# Patient Record
Sex: Male | Born: 1940 | Race: White | Hispanic: No | Smoking: Former smoker
Health system: Southern US, Community
[De-identification: ages and names within clinical notes are randomized; demographics above are authoritative.]

## PROBLEM LIST (undated history)

## (undated) DIAGNOSIS — M48061 Spinal stenosis, lumbar region without neurogenic claudication: Secondary | ICD-10-CM

## (undated) DIAGNOSIS — E785 Hyperlipidemia, unspecified: Secondary | ICD-10-CM

## (undated) DIAGNOSIS — D126 Benign neoplasm of colon, unspecified: Secondary | ICD-10-CM

## (undated) DIAGNOSIS — E8881 Metabolic syndrome: Secondary | ICD-10-CM

## (undated) DIAGNOSIS — M1990 Unspecified osteoarthritis, unspecified site: Secondary | ICD-10-CM

## (undated) DIAGNOSIS — G4700 Insomnia, unspecified: Secondary | ICD-10-CM

## (undated) DIAGNOSIS — K5790 Diverticulosis of intestine, part unspecified, without perforation or abscess without bleeding: Secondary | ICD-10-CM

## (undated) DIAGNOSIS — N400 Enlarged prostate without lower urinary tract symptoms: Secondary | ICD-10-CM

## (undated) DIAGNOSIS — K219 Gastro-esophageal reflux disease without esophagitis: Secondary | ICD-10-CM

## (undated) DIAGNOSIS — N200 Calculus of kidney: Secondary | ICD-10-CM

## (undated) DIAGNOSIS — I10 Essential (primary) hypertension: Secondary | ICD-10-CM

## (undated) DIAGNOSIS — R7301 Impaired fasting glucose: Secondary | ICD-10-CM

## (undated) DIAGNOSIS — H409 Unspecified glaucoma: Secondary | ICD-10-CM

## (undated) DIAGNOSIS — K589 Irritable bowel syndrome without diarrhea: Secondary | ICD-10-CM

## (undated) DIAGNOSIS — I2510 Atherosclerotic heart disease of native coronary artery without angina pectoris: Secondary | ICD-10-CM

## (undated) HISTORY — DX: Irritable bowel syndrome, unspecified: K58.9

## (undated) HISTORY — DX: Unspecified osteoarthritis, unspecified site: M19.90

## (undated) HISTORY — DX: Benign prostatic hyperplasia without lower urinary tract symptoms: N40.0

## (undated) HISTORY — DX: Insomnia, unspecified: G47.00

## (undated) HISTORY — DX: Gastro-esophageal reflux disease without esophagitis: K21.9

## (undated) HISTORY — DX: Essential (primary) hypertension: I10

## (undated) HISTORY — DX: Benign neoplasm of colon, unspecified: D12.6

## (undated) HISTORY — DX: Spinal stenosis, lumbar region without neurogenic claudication: M48.061

## (undated) HISTORY — DX: Atherosclerotic heart disease of native coronary artery without angina pectoris: I25.10

## (undated) HISTORY — DX: Unspecified glaucoma: H40.9

## (undated) HISTORY — DX: Impaired fasting glucose: R73.01

## (undated) HISTORY — DX: Metabolic syndrome: E88.81

## (undated) HISTORY — DX: Calculus of kidney: N20.0

## (undated) HISTORY — DX: Metabolic syndrome: E88.810

## (undated) HISTORY — DX: Hyperlipidemia, unspecified: E78.5

## (undated) HISTORY — DX: Diverticulosis of intestine, part unspecified, without perforation or abscess without bleeding: K57.90

---

## 2002-06-16 ENCOUNTER — Emergency Department (HOSPITAL_COMMUNITY): Admission: EM | Admit: 2002-06-16 | Discharge: 2002-06-16 | Payer: Self-pay | Admitting: Emergency Medicine

## 2004-03-24 ENCOUNTER — Ambulatory Visit: Payer: Self-pay | Admitting: Internal Medicine

## 2004-03-29 ENCOUNTER — Ambulatory Visit (HOSPITAL_COMMUNITY): Admission: RE | Admit: 2004-03-29 | Discharge: 2004-03-29 | Payer: Self-pay | Admitting: Internal Medicine

## 2004-07-22 ENCOUNTER — Ambulatory Visit: Payer: Self-pay | Admitting: Family Medicine

## 2004-12-24 ENCOUNTER — Ambulatory Visit: Payer: Self-pay | Admitting: Internal Medicine

## 2005-03-09 ENCOUNTER — Ambulatory Visit: Payer: Self-pay | Admitting: Internal Medicine

## 2005-05-10 ENCOUNTER — Ambulatory Visit: Payer: Self-pay | Admitting: Internal Medicine

## 2005-06-14 ENCOUNTER — Ambulatory Visit: Payer: Self-pay | Admitting: Internal Medicine

## 2005-07-13 ENCOUNTER — Ambulatory Visit: Payer: Self-pay | Admitting: Gastroenterology

## 2005-07-29 DIAGNOSIS — D126 Benign neoplasm of colon, unspecified: Secondary | ICD-10-CM

## 2005-07-29 HISTORY — DX: Benign neoplasm of colon, unspecified: D12.6

## 2005-08-04 ENCOUNTER — Encounter: Payer: Self-pay | Admitting: Gastroenterology

## 2005-08-04 ENCOUNTER — Ambulatory Visit: Payer: Self-pay | Admitting: Gastroenterology

## 2005-08-04 DIAGNOSIS — K5730 Diverticulosis of large intestine without perforation or abscess without bleeding: Secondary | ICD-10-CM | POA: Insufficient documentation

## 2005-08-04 DIAGNOSIS — D126 Benign neoplasm of colon, unspecified: Secondary | ICD-10-CM

## 2005-08-04 DIAGNOSIS — K648 Other hemorrhoids: Secondary | ICD-10-CM | POA: Insufficient documentation

## 2005-08-04 LAB — HM COLONOSCOPY

## 2005-08-15 ENCOUNTER — Ambulatory Visit: Payer: Self-pay | Admitting: Internal Medicine

## 2005-09-13 ENCOUNTER — Ambulatory Visit: Payer: Self-pay | Admitting: Internal Medicine

## 2005-11-03 ENCOUNTER — Ambulatory Visit: Payer: Self-pay | Admitting: Internal Medicine

## 2006-01-03 ENCOUNTER — Ambulatory Visit: Payer: Self-pay | Admitting: Internal Medicine

## 2006-02-01 ENCOUNTER — Ambulatory Visit: Payer: Self-pay | Admitting: Internal Medicine

## 2006-04-05 ENCOUNTER — Ambulatory Visit: Payer: Self-pay | Admitting: Internal Medicine

## 2006-06-07 ENCOUNTER — Ambulatory Visit: Payer: Self-pay | Admitting: Internal Medicine

## 2006-08-23 DIAGNOSIS — I10 Essential (primary) hypertension: Secondary | ICD-10-CM

## 2006-08-23 DIAGNOSIS — E785 Hyperlipidemia, unspecified: Secondary | ICD-10-CM | POA: Insufficient documentation

## 2006-08-23 DIAGNOSIS — E782 Mixed hyperlipidemia: Secondary | ICD-10-CM | POA: Insufficient documentation

## 2006-09-12 ENCOUNTER — Ambulatory Visit: Payer: Self-pay | Admitting: Internal Medicine

## 2006-09-18 ENCOUNTER — Ambulatory Visit: Payer: Self-pay | Admitting: Internal Medicine

## 2006-09-18 DIAGNOSIS — N400 Enlarged prostate without lower urinary tract symptoms: Secondary | ICD-10-CM | POA: Insufficient documentation

## 2006-09-18 DIAGNOSIS — N411 Chronic prostatitis: Secondary | ICD-10-CM

## 2006-09-18 DIAGNOSIS — H6690 Otitis media, unspecified, unspecified ear: Secondary | ICD-10-CM | POA: Insufficient documentation

## 2006-09-18 LAB — CONVERTED CEMR LAB
Cholesterol: 183 mg/dL (ref 0–200)
Total CHOL/HDL Ratio: 9.6

## 2006-11-16 ENCOUNTER — Ambulatory Visit: Payer: Self-pay | Admitting: Internal Medicine

## 2006-11-16 DIAGNOSIS — M545 Low back pain: Secondary | ICD-10-CM | POA: Insufficient documentation

## 2006-11-28 ENCOUNTER — Telehealth (INDEPENDENT_AMBULATORY_CARE_PROVIDER_SITE_OTHER): Payer: Self-pay | Admitting: *Deleted

## 2006-11-28 DIAGNOSIS — R42 Dizziness and giddiness: Secondary | ICD-10-CM | POA: Insufficient documentation

## 2006-11-29 ENCOUNTER — Telehealth (INDEPENDENT_AMBULATORY_CARE_PROVIDER_SITE_OTHER): Payer: Self-pay | Admitting: *Deleted

## 2007-02-19 ENCOUNTER — Ambulatory Visit: Payer: Self-pay | Admitting: Internal Medicine

## 2007-02-19 LAB — CONVERTED CEMR LAB
Cholesterol, target level: 200 mg/dL
HDL goal, serum: 40 mg/dL
LDL Goal: 100 mg/dL

## 2007-02-28 ENCOUNTER — Telehealth: Payer: Self-pay | Admitting: Internal Medicine

## 2007-03-20 ENCOUNTER — Telehealth (INDEPENDENT_AMBULATORY_CARE_PROVIDER_SITE_OTHER): Payer: Self-pay | Admitting: *Deleted

## 2007-03-27 ENCOUNTER — Encounter: Payer: Self-pay | Admitting: Internal Medicine

## 2007-04-05 ENCOUNTER — Encounter: Admission: RE | Admit: 2007-04-05 | Discharge: 2007-04-05 | Payer: Self-pay | Admitting: Otolaryngology

## 2007-04-24 ENCOUNTER — Telehealth: Payer: Self-pay | Admitting: Internal Medicine

## 2007-05-15 ENCOUNTER — Ambulatory Visit: Payer: Self-pay | Admitting: Internal Medicine

## 2007-05-15 LAB — CONVERTED CEMR LAB
Cholesterol: 149 mg/dL (ref 0–200)
HDL: 17.7 mg/dL — ABNORMAL LOW (ref >39.0)
LDL Cholesterol: 98 mg/dL (ref 0–99)

## 2007-05-22 ENCOUNTER — Ambulatory Visit: Payer: Self-pay | Admitting: Internal Medicine

## 2007-05-22 DIAGNOSIS — M5030 Other cervical disc degeneration, unspecified cervical region: Secondary | ICD-10-CM | POA: Insufficient documentation

## 2007-05-22 DIAGNOSIS — M19019 Primary osteoarthritis, unspecified shoulder: Secondary | ICD-10-CM | POA: Insufficient documentation

## 2007-05-26 ENCOUNTER — Encounter: Admission: RE | Admit: 2007-05-26 | Discharge: 2007-05-26 | Payer: Self-pay | Admitting: Internal Medicine

## 2007-05-31 ENCOUNTER — Telehealth: Payer: Self-pay | Admitting: Internal Medicine

## 2007-06-07 ENCOUNTER — Telehealth: Payer: Self-pay | Admitting: Internal Medicine

## 2007-08-06 ENCOUNTER — Telehealth: Payer: Self-pay | Admitting: Internal Medicine

## 2007-08-22 ENCOUNTER — Ambulatory Visit: Payer: Self-pay | Admitting: Internal Medicine

## 2007-08-22 LAB — CONVERTED CEMR LAB
BUN: 16 mg/dL (ref 6–23)
Basophils Absolute: 0 10*3/uL (ref 0.0–0.1)
Basophils Relative: 0.6 % (ref 0.0–1.0)
Calcium: 9.1 mg/dL (ref 8.4–10.5)
Creatinine, Ser: 1.1 mg/dL (ref 0.4–1.5)
Eosinophils Absolute: 0.2 10*3/uL (ref 0.0–0.7)
Eosinophils Relative: 2.6 % (ref 0.0–5.0)
GFR calc non Af Amer: 72 mL/min
HCT: 43.7 % (ref 39.0–52.0)
Hemoglobin: 15.5 g/dL (ref 13.0–17.0)
MCHC: 35.5 g/dL (ref 30.0–36.0)
MCV: 93 fL (ref 78.0–100.0)
Monocytes Absolute: 0.4 10*3/uL (ref 0.1–1.0)
Neutro Abs: 3.4 10*3/uL (ref 1.4–7.7)
RBC: 4.7 M/uL (ref 4.22–5.81)

## 2007-09-11 ENCOUNTER — Telehealth: Payer: Self-pay | Admitting: Internal Medicine

## 2007-09-12 ENCOUNTER — Ambulatory Visit: Payer: Self-pay | Admitting: Internal Medicine

## 2007-09-27 ENCOUNTER — Telehealth: Payer: Self-pay | Admitting: Internal Medicine

## 2007-10-24 ENCOUNTER — Ambulatory Visit: Payer: Self-pay | Admitting: Internal Medicine

## 2007-10-24 DIAGNOSIS — G4700 Insomnia, unspecified: Secondary | ICD-10-CM

## 2007-11-01 ENCOUNTER — Encounter: Payer: Self-pay | Admitting: Internal Medicine

## 2007-11-08 ENCOUNTER — Ambulatory Visit: Payer: Self-pay | Admitting: Internal Medicine

## 2007-11-19 ENCOUNTER — Encounter: Payer: Self-pay | Admitting: Internal Medicine

## 2007-11-22 ENCOUNTER — Ambulatory Visit: Payer: Self-pay | Admitting: Internal Medicine

## 2007-11-22 DIAGNOSIS — R110 Nausea: Secondary | ICD-10-CM

## 2007-12-03 ENCOUNTER — Encounter: Payer: Self-pay | Admitting: Internal Medicine

## 2007-12-06 ENCOUNTER — Ambulatory Visit: Payer: Self-pay | Admitting: Internal Medicine

## 2007-12-06 DIAGNOSIS — B369 Superficial mycosis, unspecified: Secondary | ICD-10-CM | POA: Insufficient documentation

## 2007-12-06 DIAGNOSIS — R10814 Left lower quadrant abdominal tenderness: Secondary | ICD-10-CM

## 2007-12-06 LAB — CONVERTED CEMR LAB
ALT: 29 units/L (ref 0–53)
Alkaline Phosphatase: 58 units/L (ref 39–117)
Bilirubin, Direct: 0.3 mg/dL (ref 0.0–0.3)
Eosinophils Absolute: 0.1 10*3/uL (ref 0.0–0.7)
Eosinophils Relative: 0.8 % (ref 0.0–5.0)
Monocytes Absolute: 0.6 10*3/uL (ref 0.1–1.0)
Monocytes Relative: 5.9 % (ref 3.0–12.0)
Neutrophils Relative %: 61.8 % (ref 43.0–77.0)
Platelets: 189 10*3/uL (ref 150–400)
RDW: 12.3 % (ref 11.5–14.6)
Total Bilirubin: 1.1 mg/dL (ref 0.3–1.2)
Total Protein: 7.4 g/dL (ref 6.0–8.3)
WBC: 10.3 10*3/uL (ref 4.5–10.5)

## 2007-12-12 ENCOUNTER — Encounter: Payer: Self-pay | Admitting: Internal Medicine

## 2008-01-03 ENCOUNTER — Ambulatory Visit: Payer: Self-pay | Admitting: Internal Medicine

## 2008-01-07 ENCOUNTER — Encounter: Payer: Self-pay | Admitting: Internal Medicine

## 2008-01-09 ENCOUNTER — Encounter: Payer: Self-pay | Admitting: Internal Medicine

## 2008-01-16 ENCOUNTER — Telehealth: Payer: Self-pay | Admitting: Gastroenterology

## 2008-01-16 ENCOUNTER — Telehealth: Payer: Self-pay | Admitting: Internal Medicine

## 2008-01-17 ENCOUNTER — Ambulatory Visit: Payer: Self-pay | Admitting: Internal Medicine

## 2008-01-17 ENCOUNTER — Telehealth: Payer: Self-pay | Admitting: Internal Medicine

## 2008-01-17 DIAGNOSIS — R1319 Other dysphagia: Secondary | ICD-10-CM | POA: Insufficient documentation

## 2008-01-17 DIAGNOSIS — R1114 Bilious vomiting: Secondary | ICD-10-CM

## 2008-01-25 ENCOUNTER — Telehealth: Payer: Self-pay | Admitting: Internal Medicine

## 2008-02-07 ENCOUNTER — Encounter: Payer: Self-pay | Admitting: Internal Medicine

## 2008-02-12 ENCOUNTER — Ambulatory Visit: Payer: Self-pay | Admitting: Internal Medicine

## 2008-02-12 LAB — CONVERTED CEMR LAB
AST: 20 units/L (ref 0–37)
Albumin: 4.2 g/dL (ref 3.5–5.2)
BUN: 13 mg/dL (ref 6–23)
Basophils Absolute: 0 10*3/uL (ref 0.0–0.1)
Basophils Relative: 0.5 % (ref 0.0–3.0)
Calcium: 9.5 mg/dL (ref 8.4–10.5)
Creatinine, Ser: 1 mg/dL (ref 0.4–1.5)
Eosinophils Absolute: 0.1 10*3/uL (ref 0.0–0.7)
Eosinophils Relative: 1.2 % (ref 0.0–5.0)
GFR calc Af Amer: 96 mL/min
GFR calc non Af Amer: 80 mL/min
HCT: 46.5 % (ref 39.0–52.0)
MCHC: 34.8 g/dL (ref 30.0–36.0)
MCV: 95.9 fL (ref 78.0–100.0)
Monocytes Absolute: 0.5 10*3/uL (ref 0.1–1.0)
Neutrophils Relative %: 64 % (ref 43.0–77.0)
RBC: 4.85 M/uL (ref 4.22–5.81)
Total Bilirubin: 1.2 mg/dL (ref 0.3–1.2)
WBC: 9.7 10*3/uL (ref 4.5–10.5)

## 2008-02-14 ENCOUNTER — Telehealth: Payer: Self-pay | Admitting: Internal Medicine

## 2008-02-14 ENCOUNTER — Encounter: Admission: RE | Admit: 2008-02-14 | Discharge: 2008-02-14 | Payer: Self-pay | Admitting: Neurology

## 2008-02-19 ENCOUNTER — Emergency Department (HOSPITAL_COMMUNITY): Admission: EM | Admit: 2008-02-19 | Discharge: 2008-02-19 | Payer: Self-pay | Admitting: Emergency Medicine

## 2008-02-19 ENCOUNTER — Encounter (INDEPENDENT_AMBULATORY_CARE_PROVIDER_SITE_OTHER): Payer: Self-pay | Admitting: *Deleted

## 2008-02-20 ENCOUNTER — Telehealth: Payer: Self-pay | Admitting: Internal Medicine

## 2008-02-25 ENCOUNTER — Telehealth: Payer: Self-pay | Admitting: Internal Medicine

## 2008-02-25 ENCOUNTER — Ambulatory Visit: Payer: Self-pay | Admitting: Internal Medicine

## 2008-02-25 ENCOUNTER — Telehealth: Payer: Self-pay | Admitting: Gastroenterology

## 2008-02-25 DIAGNOSIS — R1032 Left lower quadrant pain: Secondary | ICD-10-CM | POA: Insufficient documentation

## 2008-02-25 DIAGNOSIS — I2510 Atherosclerotic heart disease of native coronary artery without angina pectoris: Secondary | ICD-10-CM | POA: Insufficient documentation

## 2008-02-26 ENCOUNTER — Telehealth: Payer: Self-pay | Admitting: Internal Medicine

## 2008-02-26 LAB — CONVERTED CEMR LAB
Bilirubin Urine: NEGATIVE
CO2: 31 meq/L (ref 19–32)
Chloride: 99 meq/L (ref 96–112)
Creatinine, Ser: 1.9 mg/dL — ABNORMAL HIGH (ref 0.4–1.5)
GFR calc non Af Amer: 38 mL/min
Leukocytes, UA: NEGATIVE
Nitrite: NEGATIVE
Sodium: 139 meq/L (ref 135–145)
Specific Gravity, Urine: 1.02 (ref 1.000–1.03)
Total Protein, Urine: NEGATIVE mg/dL
pH: 5 (ref 5.0–8.0)

## 2008-02-27 ENCOUNTER — Ambulatory Visit (HOSPITAL_COMMUNITY): Admission: RE | Admit: 2008-02-27 | Discharge: 2008-02-27 | Payer: Self-pay | Admitting: Internal Medicine

## 2008-02-28 ENCOUNTER — Telehealth: Payer: Self-pay | Admitting: Internal Medicine

## 2008-03-03 ENCOUNTER — Ambulatory Visit: Payer: Self-pay | Admitting: Internal Medicine

## 2008-03-03 DIAGNOSIS — R252 Cramp and spasm: Secondary | ICD-10-CM | POA: Insufficient documentation

## 2008-03-04 LAB — CONVERTED CEMR LAB
CO2: 29 meq/L (ref 19–32)
Calcium: 9 mg/dL (ref 8.4–10.5)
GFR calc Af Amer: 71 mL/min
Magnesium: 1.8 mg/dL (ref 1.5–2.5)
Potassium: 4.1 meq/L (ref 3.5–5.1)
Sodium: 143 meq/L (ref 135–145)

## 2008-03-11 ENCOUNTER — Ambulatory Visit: Payer: Self-pay | Admitting: Internal Medicine

## 2008-04-25 ENCOUNTER — Telehealth: Payer: Self-pay | Admitting: Internal Medicine

## 2008-04-28 ENCOUNTER — Telehealth: Payer: Self-pay | Admitting: *Deleted

## 2008-04-29 ENCOUNTER — Encounter: Payer: Self-pay | Admitting: Internal Medicine

## 2008-05-13 ENCOUNTER — Ambulatory Visit: Payer: Self-pay | Admitting: Internal Medicine

## 2008-05-19 ENCOUNTER — Telehealth: Payer: Self-pay | Admitting: Internal Medicine

## 2008-06-05 ENCOUNTER — Telehealth: Payer: Self-pay | Admitting: Internal Medicine

## 2008-06-09 ENCOUNTER — Telehealth: Payer: Self-pay | Admitting: Internal Medicine

## 2008-08-12 ENCOUNTER — Ambulatory Visit: Payer: Self-pay | Admitting: Internal Medicine

## 2008-10-13 ENCOUNTER — Telehealth: Payer: Self-pay | Admitting: Internal Medicine

## 2008-10-13 ENCOUNTER — Telehealth: Payer: Self-pay | Admitting: Gastroenterology

## 2008-10-14 ENCOUNTER — Telehealth: Payer: Self-pay | Admitting: Gastroenterology

## 2008-11-13 ENCOUNTER — Ambulatory Visit: Payer: Self-pay | Admitting: Internal Medicine

## 2008-11-13 LAB — CONVERTED CEMR LAB
Basophils Relative: 0.7 % (ref 0.0–3.0)
Eosinophils Relative: 1.3 % (ref 0.0–5.0)
HCT: 48.9 % (ref 39.0–52.0)
Monocytes Relative: 5 % (ref 3.0–12.0)
Neutrophils Relative %: 59 % (ref 43.0–77.0)
Platelets: 194 10*3/uL (ref 150.0–400.0)
RBC: 5.21 M/uL (ref 4.22–5.81)
WBC: 8.5 10*3/uL (ref 4.5–10.5)

## 2008-11-17 ENCOUNTER — Telehealth: Payer: Self-pay | Admitting: Gastroenterology

## 2008-11-25 ENCOUNTER — Telehealth: Payer: Self-pay | Admitting: Internal Medicine

## 2008-12-10 ENCOUNTER — Encounter: Payer: Self-pay | Admitting: Internal Medicine

## 2008-12-31 ENCOUNTER — Encounter: Payer: Self-pay | Admitting: Gastroenterology

## 2009-01-07 ENCOUNTER — Ambulatory Visit: Payer: Self-pay | Admitting: Gastroenterology

## 2009-01-07 DIAGNOSIS — Z860100 Personal history of colon polyps, unspecified: Secondary | ICD-10-CM | POA: Insufficient documentation

## 2009-01-12 ENCOUNTER — Encounter (INDEPENDENT_AMBULATORY_CARE_PROVIDER_SITE_OTHER): Payer: Self-pay | Admitting: *Deleted

## 2009-01-15 ENCOUNTER — Encounter (INDEPENDENT_AMBULATORY_CARE_PROVIDER_SITE_OTHER): Payer: Self-pay | Admitting: *Deleted

## 2009-01-15 ENCOUNTER — Encounter: Admission: RE | Admit: 2009-01-15 | Discharge: 2009-01-15 | Payer: Self-pay | Admitting: Sports Medicine

## 2009-01-27 ENCOUNTER — Encounter (INDEPENDENT_AMBULATORY_CARE_PROVIDER_SITE_OTHER): Payer: Self-pay

## 2009-01-27 ENCOUNTER — Encounter: Admission: RE | Admit: 2009-01-27 | Discharge: 2009-01-27 | Payer: Self-pay | Admitting: Sports Medicine

## 2009-01-30 ENCOUNTER — Ambulatory Visit: Payer: Self-pay | Admitting: Gastroenterology

## 2009-01-30 LAB — CONVERTED CEMR LAB
OCCULT 1: NEGATIVE
OCCULT 2: NEGATIVE
OCCULT 4: NEGATIVE
OCCULT 5: NEGATIVE

## 2009-02-04 ENCOUNTER — Ambulatory Visit: Payer: Self-pay | Admitting: Internal Medicine

## 2009-02-04 DIAGNOSIS — K589 Irritable bowel syndrome without diarrhea: Secondary | ICD-10-CM | POA: Insufficient documentation

## 2009-02-24 ENCOUNTER — Encounter: Payer: Self-pay | Admitting: Internal Medicine

## 2009-03-04 ENCOUNTER — Ambulatory Visit: Payer: Self-pay | Admitting: Gastroenterology

## 2009-04-01 ENCOUNTER — Encounter: Payer: Self-pay | Admitting: Internal Medicine

## 2009-04-08 ENCOUNTER — Ambulatory Visit: Payer: Self-pay | Admitting: Internal Medicine

## 2009-07-08 ENCOUNTER — Ambulatory Visit: Payer: Self-pay | Admitting: Internal Medicine

## 2009-07-08 DIAGNOSIS — M542 Cervicalgia: Secondary | ICD-10-CM

## 2009-07-08 DIAGNOSIS — J3089 Other allergic rhinitis: Secondary | ICD-10-CM | POA: Insufficient documentation

## 2009-08-03 ENCOUNTER — Telehealth: Payer: Self-pay | Admitting: Internal Medicine

## 2009-08-04 ENCOUNTER — Encounter: Payer: Self-pay | Admitting: Internal Medicine

## 2009-08-06 ENCOUNTER — Telehealth: Payer: Self-pay | Admitting: Internal Medicine

## 2009-08-07 ENCOUNTER — Telehealth: Payer: Self-pay | Admitting: Gastroenterology

## 2009-08-11 ENCOUNTER — Ambulatory Visit: Payer: Self-pay | Admitting: Gastroenterology

## 2009-08-11 DIAGNOSIS — M1990 Unspecified osteoarthritis, unspecified site: Secondary | ICD-10-CM | POA: Insufficient documentation

## 2009-09-08 ENCOUNTER — Ambulatory Visit: Payer: Self-pay | Admitting: Internal Medicine

## 2009-09-08 LAB — CONVERTED CEMR LAB
BUN: 18 mg/dL (ref 6–23)
CO2: 29 meq/L (ref 19–32)
Calcium: 9.5 mg/dL (ref 8.4–10.5)
Chloride: 107 meq/L (ref 96–112)
Creatinine, Ser: 1.1 mg/dL (ref 0.4–1.5)
Direct LDL: 102.1 mg/dL
GFR calc non Af Amer: 74.1 mL/min (ref >60)
Glucose, Bld: 96 mg/dL (ref 70–99)
HDL: 24 mg/dL — ABNORMAL LOW (ref >39.00)
Potassium: 5.3 meq/L — ABNORMAL HIGH (ref 3.5–5.1)
Sodium: 144 meq/L (ref 135–145)
TSH: 1.62 microintl units/mL (ref 0.35–5.50)

## 2009-09-11 ENCOUNTER — Encounter: Payer: Self-pay | Admitting: Internal Medicine

## 2009-09-17 ENCOUNTER — Telehealth: Payer: Self-pay | Admitting: Internal Medicine

## 2009-09-29 ENCOUNTER — Telehealth: Payer: Self-pay | Admitting: Internal Medicine

## 2009-11-05 ENCOUNTER — Encounter: Payer: Self-pay | Admitting: Internal Medicine

## 2009-11-12 ENCOUNTER — Ambulatory Visit: Payer: Self-pay | Admitting: Internal Medicine

## 2009-11-12 DIAGNOSIS — L218 Other seborrheic dermatitis: Secondary | ICD-10-CM | POA: Insufficient documentation

## 2009-12-15 ENCOUNTER — Telehealth: Payer: Self-pay | Admitting: Internal Medicine

## 2010-01-04 ENCOUNTER — Encounter: Payer: Self-pay | Admitting: Internal Medicine

## 2010-01-12 ENCOUNTER — Ambulatory Visit: Payer: Self-pay | Admitting: Internal Medicine

## 2010-02-25 ENCOUNTER — Encounter: Payer: Self-pay | Admitting: Internal Medicine

## 2010-03-08 IMAGING — CR DG CERVICAL SPINE COMPLETE 4+V
5 series · 5 of 5 positions shown · non-contrast
Comparison: [HOSPITAL] at [HOSPITAL] cervical spine
MRI 05/26/2007.

CLINICAL DATA: Neck pain without injury.

CERVICAL SPINE - COMPLETE 4+ VIEW

[w c-spine lat]
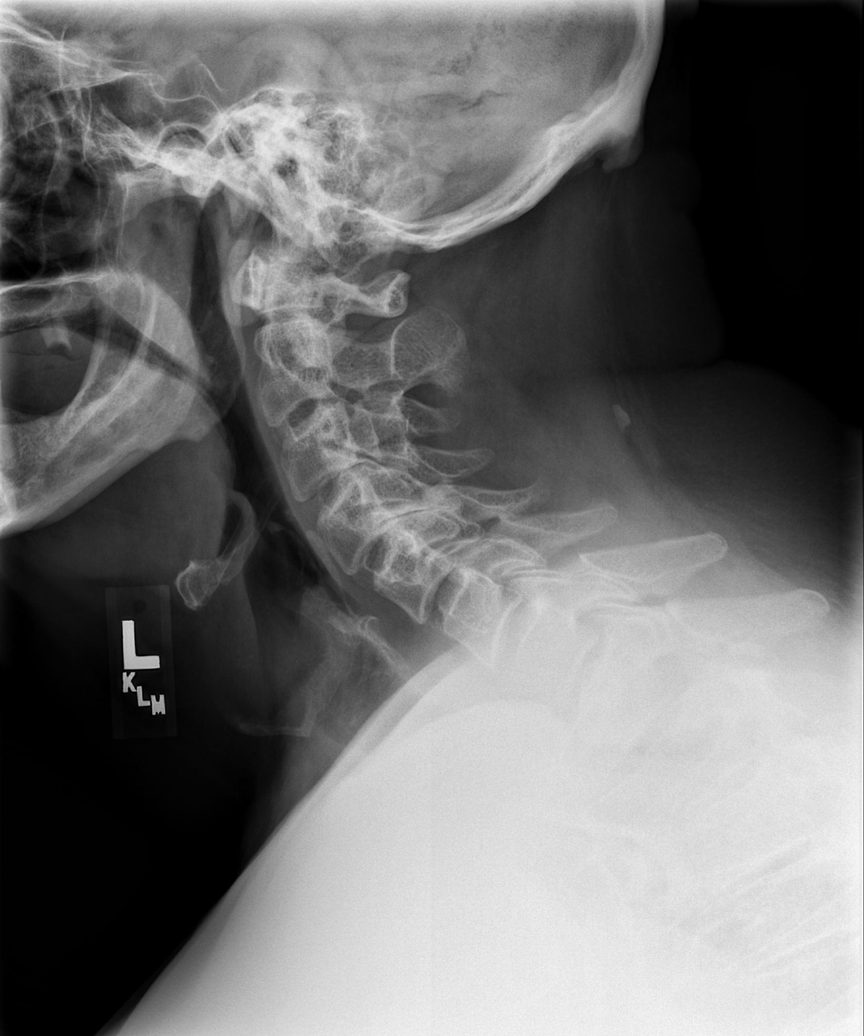

[w c-spine extension]
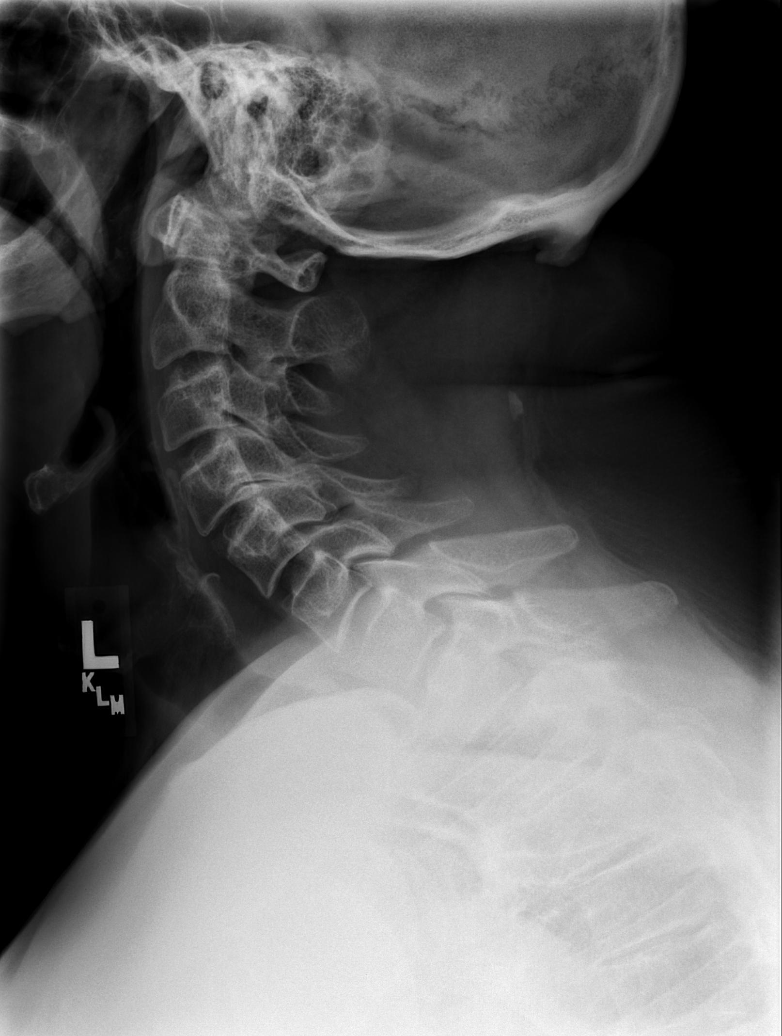

[w c-spine flexion]
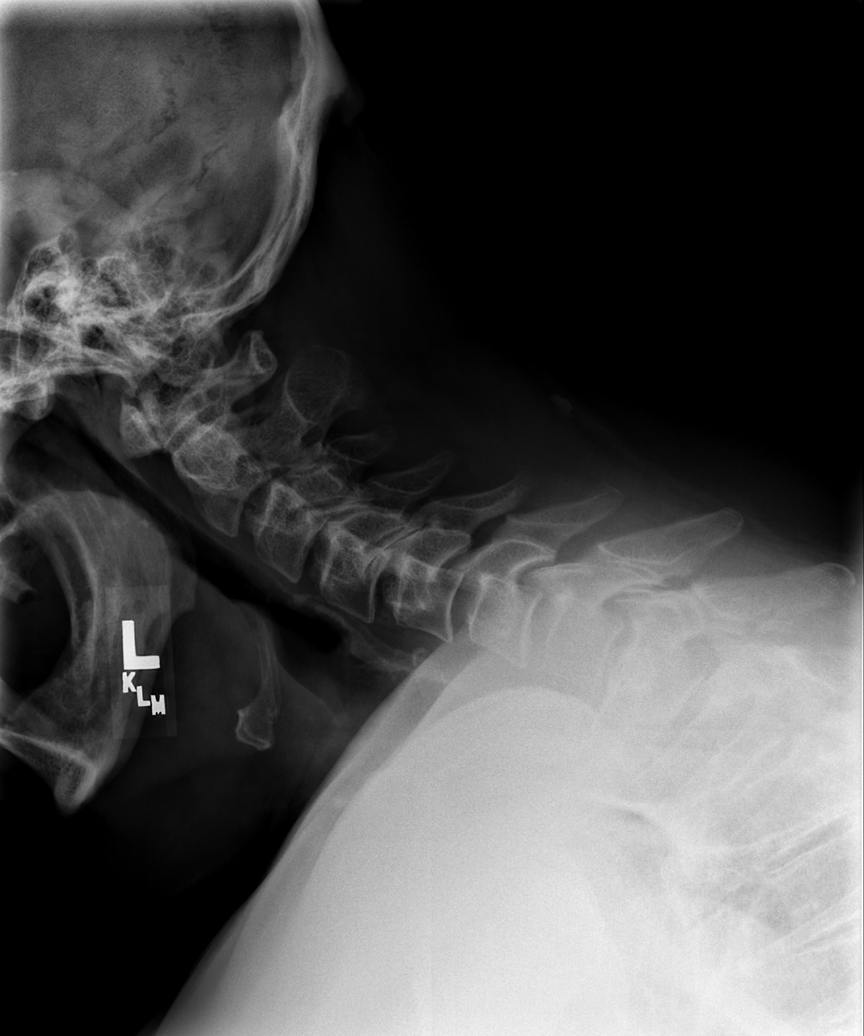

[w c-spine a.p. *]
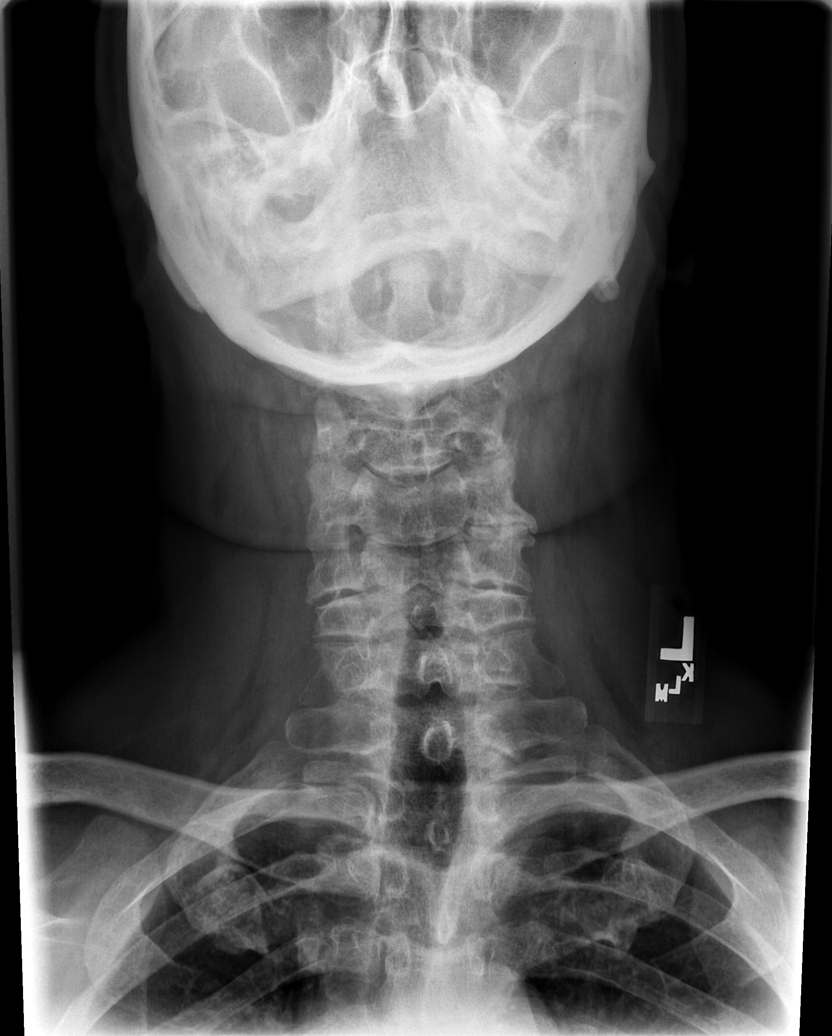

[w c-spine odontoid *]
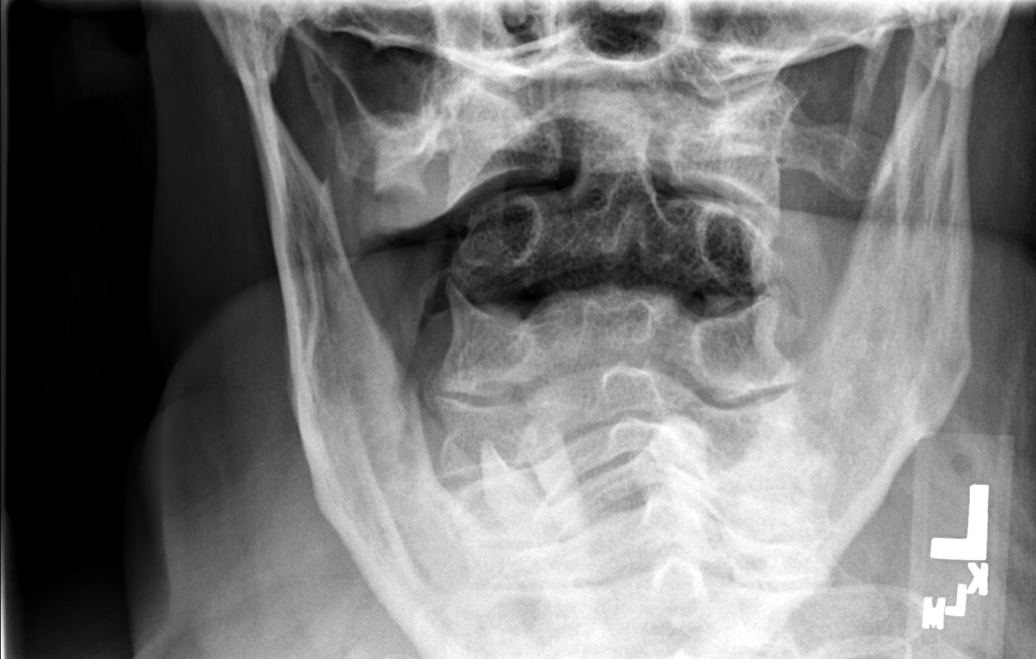

[5 of 5 positions shown; findings below may reference images not displayed]

FINDINGS: Moderate left C4-5 facet degenerative joint disease and
slight bilateral C4-5 through C6-7 uncinate degenerative joint
disease visualized.  Cervical disc spaces and vertebral alignment
normally maintained on lateral neutral and flexion extension views.
No new significant abnormality is seen.
IMPRESSION: 1.  Moderate left C4-5 facet degenerative joint disease with slight
uncinate degenerative joint disease C4-5 through C6-7.
2.  Otherwise, negative.

## 2010-03-25 ENCOUNTER — Encounter: Payer: Self-pay | Admitting: Internal Medicine

## 2010-03-30 NOTE — Letter (Signed)
Summary: [facility]  [facility]   Imported By: Maryln Gottron 04/06/2009 13:52:21  _____________________________________________________________________  External Attachment:    Type:   Image     Comment:   External Document

## 2010-03-30 NOTE — Progress Notes (Signed)
Summary: requesting cream  Phone Note Call from Patient Call back at Home Phone [phone]   Caller: [facility] mail Reason for Call: Acute Illness, Referral Summary of Call: wants something for itching in groin area. In the past, he was prescibed Fluconazole 100mg  and they gave him a headache. Wants vesonive 0-05 cream or something stronger. call [facility]. Initial call taken by: Warnell Forester,  December 15, 2009 3:41 PM  Follow-up for Phone Call        lotrimin otc Follow-up by: Willy Eddy, LPN,  December 15, 2009 3:42 PM

## 2010-03-30 NOTE — Assessment & Plan Note (Signed)
Summary: 2 MONTH F/U//ALP   Vital Signs:  Patient profile:   70 year old male Height:      73 inches Weight:      264 pounds BMI:     34.96 Temp:     98.2 degrees F oral Pulse rate:   72 / minute Resp:     14 per minute BP sitting:   138 / 80  (left arm)  Vitals Entered By: Willy Eddy, LPN (November 12, 2009 10:03 AM)  Nutrition Counseling: Patient's BMI is greater than 25 and therefore counseled on weight management options. CC: roa, Hypertension Management Is Patient Diabetic? No   Primary Care Provider:  Darryll Capers MD  CC:  roa and Hypertension Management.  History of Present Illness: Has seen the neurologist and he  states that no diagnosis was made 81 mg aspirin was added Has ear itching fro 6 months with pain radiating into his jaws He used "old" ear drops that the ear doctor gave him a year ago ( floxin drops) and this seemed to help Has a rash ( has been to the dermatologist) that persists. He was given desonide .. the results in drying of the skin ( groin)   Hypertension History:      He denies headache, chest pain, palpitations, dyspnea with exertion, orthopnea, PND, peripheral edema, visual symptoms, neurologic problems, syncope, and side effects from treatment.        Positive major cardiovascular risk factors include male age 55 years old or older, hyperlipidemia, and hypertension.  Negative major cardiovascular risk factors include non-tobacco-user status.     Preventive Screening-Counseling & Management  Alcohol-Tobacco     Smoking Status: quit     Packs/Day: 1.5-2 packs per day     Year Started: 1977     Year Quit: 1995     Tobacco Counseling: to remain off tobacco products  Problems Prior to Update: 1)  Other Seborrheic Dermatitis  (ICD-690.18) 2)  Osteoarthritis  (ICD-715.9) 3)  Neck Pain, Left  (ICD-723.1) 4)  Allergic Rhinitis Due To Other Allergen  (ICD-477.8) 5)  Irritable Bowel Syndrome  (ICD-564.1) 6)  Personal Hx Colonic Polyps   (ICD-V12.72) 7)  Abdominal Pain, Left Lower Quadrant  (ICD-789.04) 8)  Leg Cramps  (ICD-729.82) 9)  Renal Insufficiency, Acute  (ICD-585.9) 10)  Abdominal Pain, Left Lower Quadrant  (ICD-789.04) 11)  Dysphagia  (ICD-787.29) 12)  Bilious Emesis  (ICD-787.04) 13)  Dizziness  (ICD-780.4) 14)  Nausea  (ICD-787.02) 15)  Hemorrhoids, Internal  (ICD-455.0) 16)  Diverticulosis, Colon  (ICD-562.10) 17)  Colonic Polyps  (ICD-211.3) 18)  Fungal Dermatitis  (ICD-111.9) 19)  Abdominal Tenderness, Left Lower Quadrant  (ICD-789.64) 20)  Nausea  (ICD-787.02) 21)  Insomnia  (ICD-780.52) 22)  Degenerative Disc Disease, Cervical Spine  (ICD-722.4) 23)  Arthritis, 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 One By Mouth Daily  ( Replaced The Avodart) 2)  Cozaar 100 Mg Tabs (Losartan Potassium) .Marland Kitchen.. 1 Once Daily 3)  Bystolic 5 Mg Tabs (Nebivolol Hcl) .... One By Mouth Daily 4)  Baclofen 10 Mg Tabs (Baclofen) .... Oen By Mouth Q Hs 5)  Promethazine Hcl 25 Mg Tabs (Promethazine Hcl) .... Take 1/2 To  1 Every 6 Hours As Needed For Nausea 6)  Align  Caps (Probiotic Product) .... Take 1 Cap Daily  Current Medications (verified): 1)  Finasteride 5 Mg Tabs (Finasteride) .... One By Mouth Daily  ( Replaced The Avodart) 2)  Cozaar 100 Mg Tabs (Losartan Potassium) .Marland Kitchen.. 1 Once Daily 3)  Bystolic 5 Mg Tabs (Nebivolol Hcl) .... One By Mouth Daily 4)  Baclofen 10 Mg Tabs (Baclofen) .... Oen By Mouth Q Hs As Needed 5)  Promethazine Hcl 25 Mg Tabs (Promethazine Hcl) .... Take 1/2 To 1 Every 6 Hours As Needed For Nausea 6)  Align  Caps (Probiotic Product) .... Take 1 Cap Daily 7)   Clotrimazole-Betamethasone 1-0.05 % Lotn (Clotrimazole-Betamethasone) .... 4-5 Drops in The Right Ear Two Times A Day 8)  Fluconazole 100 Mg Tabs (Fluconazole) .... One By Mouth Daily For 21 Days  Allergies (verified): 1)  * Fluoxin  Past History:  Family History: Last updated: 01/07/2009 Family History Hypertension Family History Kidney disease Family History of Prostate Cancer: Family History of Colon Polyps:  Social History: Last updated: 01/07/2009 Former Smoker Alcohol use-no Occupation:Retired Illicit Drug Use - no 2 children  Risk Factors: Smoking Status: quit (11/12/2009) Packs/Day: 1.5-2 packs per day (11/12/2009)  Past medical, surgical, family and social histories (including risk factors) reviewed, and no changes noted (except as noted below).  Past Medical History: Reviewed history from 03/04/2009 and no changes required. Hyperlipidemia Hypertension Benign prostatic hypertrophy Degenerative dics disease L3 L4 stenosis Tublar adenomatous colon polyps 07/2005 Diverticulosis Hemorrhoids  Past Surgical History: Reviewed history from 01/07/2009 and no changes required. Unremarkable  Family History: Reviewed history from 01/07/2009 and no changes required. Family History Hypertension Family History Kidney disease Family History of Prostate Cancer: Family History of Colon Polyps:  Social History: Reviewed history from 01/07/2009 and no changes required. Former Smoker Alcohol use-no Occupation:Retired Illicit Drug Use - no 2 children  Review of Systems  The patient denies anorexia, fever, weight loss, weight gain, vision loss, decreased hearing, hoarseness, chest pain, syncope, dyspnea on exertion, peripheral edema, prolonged cough, headaches, hemoptysis, abdominal pain, melena, hematochezia, severe indigestion/heartburn, hematuria, incontinence, genital sores, muscle weakness, suspicious skin lesions, transient blindness, difficulty walking,  depression, unusual weight change, abnormal bleeding, enlarged lymph nodes, angioedema, and breast masses.    Physical Exam  General:  alert and overweight-appearing.   Head:  normocephalic and atraumatic.   Eyes:  pupils equal and pupils round.   Ears:  R canal inflamed and L canal drainage.   Nose:  no external deformity and no external erythema.   Mouth:  No deformity or lesions, dentition normal. Neck:  Supple; no masses or thyromegaly. Lungs:  Clear throughout to auscultation. Heart:  Regular rate and rhythm; no murmurs, rubs,  or bruits. Genitalia:  tinea cruris.     Impression & Recommendations:  Problem # 1:  OTHER SEBORRHEIC DERMATITIS (ICD-690.18) in ear cannals  Problem # 2:  FUNGAL DERMATITIS (ICD-111.9) Assessment: Unchanged in groin, recurrent His updated medication list for this problem includes:    Clotrimazole-betamethasone 1-0.05 % Lotn (Clotrimazole-betamethasone) .Marland KitchenMarland KitchenMarland KitchenMarland Kitchen 4-5 drops in the right ear two times a day    Fluconazole 100 Mg Tabs (Fluconazole) ..... One by mouth daily for 21 days  Take medication as directed for full duration.   Problem # 3:  LOW BACK PAIN (ICD-724.2) Assessment: Deteriorated  His updated medication list for this problem includes:    Baclofen 10 Mg Tabs (Baclofen) ..... Oen by mouth q hs as needed  Discussed  use of moist heat or ice, modified activities, medications, and stretching/strengthening exercises. Back care instructions given. To be seen in 2 weeks if no improvement; sooner if worsening of symptoms.   Problem # 4:  HYPERTENSION (ICD-401.9) Assessment: Unchanged  His updated medication list for this problem includes:    Cozaar 100 Mg Tabs (Losartan potassium) .Marland Kitchen... 1 once daily    Bystolic 5 Mg Tabs (Nebivolol hcl) ..... One by mouth daily  Complete Medication List: 1)  Finasteride 5 Mg Tabs (Finasteride) .... One by mouth daily  ( replaced the avodart) 2)  Cozaar 100 Mg Tabs (Losartan potassium) .Marland Kitchen.. 1 once daily 3)   Bystolic 5 Mg Tabs (Nebivolol hcl) .... One by mouth daily 4)  Baclofen 10 Mg Tabs (Baclofen) .... Oen by mouth q hs as needed 5)  Promethazine Hcl 25 Mg Tabs (Promethazine hcl) .... Take 1/2 to 1 every 6 hours as needed for nausea 6)  Align Caps (Probiotic product) .... Take 1 cap daily 7)  Clotrimazole-betamethasone 1-0.05 % Lotn (Clotrimazole-betamethasone) .... 4-5 drops in the right ear two times a day 8)  Fluconazole 100 Mg Tabs (Fluconazole) .... One by mouth daily for 21 days  Hypertension Assessment/Plan:      The patient's hypertensive risk group is category B: At least one risk factor (excluding diabetes) with no target organ damage.  His calculated 10 year risk of coronary heart disease is 14 %.  Today's blood pressure is 138/80.  His blood pressure goal is < 140/90.  Patient Instructions: 1)  Please schedule a follow-up appointment in 2 months. Prescriptions: FLUCONAZOLE 100 MG TABS (FLUCONAZOLE) one by mouth daily for 21 days  #21 x 0   Entered and Authorized by:   Stacie Glaze MD   Signed by:   Stacie Glaze MD on 11/12/2009   Method used:   Electronically to        [facility]  [phone]* (retail)       [address]       [place], [place]  [address]       Ph: [phone] or [phone]       Fax: [phone]   RxID:   [id number] CLOTRIMAZOLE-BETAMETHASONE 1-0.05 % LOTN (CLOTRIMAZOLE-BETAMETHASONE) 4-5 drops in the right ear two times a day  #15cc x 3   Entered and Authorized by:   Stacie Glaze MD   Signed by:   Stacie Glaze MD on 11/12/2009   Method used:   Electronically to        [facility]  [phone]* (retail)       [address]       [place], [place]  [address]       Ph: [phone] or [phone]       Fax: [phone]   RxID:   [id number] COZAAR 100 MG TABS (LOSARTAN POTASSIUM) 1 once daily  #90 x 3   Entered by:   Willy Eddy, LPN   Authorized by:   Stacie Glaze MD   Signed by:   Willy Eddy, LPN on 42/70/6237   Method used:   Electronically to        [facility]  [phone]* (retail)       [address]       [place], [place]  [address]  Ph: [phone] or [phone]       Fax: [phone]   RxID:   [id number] FINASTERIDE 5 MG TABS (FINASTERIDE) one by mouth daily  ( replaced the avodart)  #90 x 3   Entered by:   Willy Eddy, LPN   Authorized by:   Stacie Glaze MD   Signed by:   Willy Eddy, LPN on 44/02/270   Method used:   Electronically to        [facility]  [phone]* (retail)       [address]       [place], [place]  [address]       Ph: [phone] or [phone]       Fax: [phone]   RxID:   [id number]

## 2010-03-30 NOTE — Progress Notes (Signed)
Summary: requesting neuro referral  Phone Note Call from Patient Call back at Home Phone [phone]   Caller: Patient--live call Reason for Call: Referral Summary of Call: Wants referral to see a neurologist. has vertigo. He says that it is getting worse. Also having neck and back pain. Initial call taken by: Warnell Forester,  September 17, 2009 9:15 AM  Follow-up for Phone Call        ok to refer per dr Lovell Sheehan Follow-up by: Willy Eddy, LPN,  September 17, 2009 11:50 AM

## 2010-03-30 NOTE — Assessment & Plan Note (Signed)
Summary: 6 WEEK F/U.Marland KitchenAM   History of Present Illness Visit Type: Follow-up Visit Primary GI MD: Elie Goody MD [facility] Primary Provider: Darryll Capers MD Requesting Provider: n/a Chief Complaint: abdominal pain has improved History of Present Illness:   Wesley Johnson has had substantial improvement in his left-sided abdominal pain with the regular use of dicyclomine. He is no longer constipated.  He was evaluated at [facility] and was found to have bulging lumbar discs and degenerative disc disease with possible radiculopathy. He states he subsequently had an injection of his back performed at [facility] imaging.   GI Review of Systems    Reports abdominal pain, bloating, dysphagia with solids, and  nausea.      Denies acid reflux, belching, chest pain, dysphagia with liquids, heartburn, loss of appetite, vomiting, vomiting blood, weight loss, and  weight gain.      Reports change in bowel habits.     Denies anal fissure, black tarry stools, constipation, diarrhea, diverticulosis, fecal incontinence, heme positive stool, hemorrhoids, irritable bowel syndrome, jaundice, light color stool, liver problems, rectal bleeding, and  rectal pain.   Current Medications (verified): 1)  Finasteride 5 Mg Tabs (Finasteride) .... One By Mouth Daily  ( Replaced The Avodart) 2)  Cozaar 100 Mg Tabs (Losartan Potassium) .Marland Kitchen.. 1 Once Daily 3)  Bystolic 5 Mg Tabs (Nebivolol Hcl) .... One By Mouth Daily 4)  Baclofen 10 Mg Tabs (Baclofen) .... Oen By Mouth Q Hs 5)  Dicyclomine Hcl 10 Mg Caps (Dicyclomine Hcl) .... One By Mouth Bid  Allergies (verified): 1)  * Fluoxin  Past History:  Past Medical History: Hyperlipidemia Hypertension Benign prostatic hypertrophy Degenerative dics disease L3 L4 stenosis Tublar adenomatous colon polyps 07/2005 Diverticulosis Hemorrhoids  Past Surgical History: Reviewed history from 01/07/2009 and no changes required. Unremarkable  Family  History: Reviewed history from 01/07/2009 and no changes required. Family History Hypertension Family History Kidney disease Family History of Prostate Cancer: Family History of Colon Polyps:  Social History: Reviewed history from 01/07/2009 and no changes required. Former Smoker Alcohol use-no Occupation:Retired Illicit Drug Use - no 2 children  Review of Systems       The patient complains of arthritis/joint pain, back pain, change in vision, headaches-new, muscle pains/cramps, shortness of breath, sleeping problems, and vision changes.         The pertinent positives and negatives are noted as above and in the HPI. All other ROS were reviewed and were negative.   Vital Signs:  Patient profile:   70 year old male Height:      73 inches Weight:      272.25 pounds BMI:     36.05 Pulse rate:   72 / minute Pulse rhythm:   regular BP sitting:   140 / 80  (left arm) Cuff size:   regular  Vitals Entered By: June McMurray CMA Duncan Dull) (March 04, 2009 2:29 PM)  Physical Exam  General:  Well developed, well nourished, no acute distress. obese.   Head:  Normocephalic and atraumatic. Eyes:  PERRLA, no icterus. Mouth:  No deformity or lesions, dentition normal. Lungs:  Clear throughout to auscultation. Heart:  Regular rate and rhythm; no murmurs, rubs,  or bruits. Abdomen:  Soft, large, protuberant, nontender and nondistended. No masses, hepatosplenomegaly or hernias noted. Normal bowel sounds. Psych:  Alert and cooperative. Normal mood and affect.   Impression & Recommendations:  Problem # 1:  ABDOMINAL PAIN, LEFT LOWER QUADRANT (ICD-789.04) Mild left lower quadrant pain, which has improved  with improvement in his constipation in the regular use of anti-spasmodic. Continue Bentyl t.i.d. p.r.n. Ongoing follow up with Dr. Lovell Sheehan I will see her back on referral.  Problem # 2:  PERSONAL HX COLONIC POLYPS (ICD-V12.72) Surveillance colonoscopy recommended June 2012.  Patient  Instructions: 1)  Please continue current medications.  2)  Please schedule a follow-up appointment as needed.  3)  Copy sent to : Darryll Capers, MD 4)  The medication list was reviewed and reconciled.  All changed / newly prescribed medications were explained.  A complete medication list was provided to the patient / caregiver.

## 2010-03-30 NOTE — Assessment & Plan Note (Signed)
Summary: 2 month rov/njr   Vital Signs:  Patient profile:   70 year old male Height:      73 inches Weight:      264 pounds BMI:     34.96 Temp:     98.2 degrees F oral Pulse rate:   72 / minute Resp:     14 per minute BP sitting:   130 / 78  (left arm)  Vitals Entered By: Willy Eddy, LPN (January 12, 2010 1:47 PM) CC: roa- c/o left hip pain-only took fluconazole for 3 days and then stopper stating it caused headache, Hypertension Management Is Patient Diabetic? No   Primary Care Provider:  Darryll Capers MD  CC:  roa- c/o left hip pain-only took fluconazole for 3 days and then stopper stating it caused headache and Hypertension Management.  History of Present Illness: Having increased back pain on the left side  Took tamsulosin for enarged prostate and experienced dizzyness with "the first pill" The flomax heled but he could not tolerate the medications  Hypertension History:      He denies headache, chest pain, palpitations, dyspnea with exertion, orthopnea, PND, peripheral edema, visual symptoms, neurologic problems, syncope, and side effects from treatment.        Positive major cardiovascular risk factors include male age 37 years old or older, hyperlipidemia, and hypertension.  Negative major cardiovascular risk factors include non-tobacco-user status.     Preventive Screening-Counseling & Management  Alcohol-Tobacco     Smoking Status: quit     Packs/Day: 1.5-2 packs per day     Year Started: 1977     Year Quit: 1995     Tobacco Counseling: to remain off tobacco products  Problems Prior to Update: 1)  Other Seborrheic Dermatitis  (ICD-690.18) 2)  Osteoarthritis  (ICD-715.9) 3)  Neck Pain, Left  (ICD-723.1) 4)  Allergic Rhinitis Due To Other Allergen  (ICD-477.8) 5)  Irritable Bowel Syndrome  (ICD-564.1) 6)  Personal Hx Colonic Polyps  (ICD-V12.72) 7)  Abdominal Pain, Left Lower Quadrant  (ICD-789.04) 8)  Leg Cramps  (ICD-729.82) 9)  Renal  Insufficiency, Acute  (ICD-585.9) 10)  Abdominal Pain, Left Lower Quadrant  (ICD-789.04) 11)  Dysphagia  (ICD-787.29) 12)  Bilious Emesis  (ICD-787.04) 13)  Dizziness  (ICD-780.4) 14)  Nausea  (ICD-787.02) 15)  Hemorrhoids, Internal  (ICD-455.0) 16)  Diverticulosis, Colon  (ICD-562.10) 17)  Colonic Polyps  (ICD-211.3) 18)  Fungal Dermatitis  (ICD-111.9) 19)  Abdominal Tenderness, Left Lower Quadrant  (ICD-789.64) 20)  Nausea  (ICD-787.02) 21)  Insomnia  (ICD-780.52) 22)  Degenerative Disc Disease, Cervical Spine  (ICD-722.4) 23)  Arthritis, 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) 24)  Postural Lightheadedness  (ICD-780.4) 25)  Low Back Pain  (ICD-724.2) 26)  Otitis Media, Bilateral  (ICD-382.9) 27)  Prostatitis, Chronic  (ICD-601.1) 28)  Benign Prostatic Hypertrophy  (ICD-600.00) 29)  Hypertension  (ICD-401.9) 30)  Hyperlipidemia  (ICD-272.4)  Current Problems (verified): 1)  Other Seborrheic Dermatitis  (ICD-690.18) 2)  Osteoarthritis  (ICD-715.9) 3)  Neck Pain, Left  (ICD-723.1) 4)  Allergic Rhinitis Due To Other Allergen  (ICD-477.8) 5)  Irritable Bowel Syndrome  (ICD-564.1) 6)  Personal Hx Colonic Polyps  (ICD-V12.72) 7)  Abdominal Pain, Left Lower Quadrant  (ICD-789.04) 8)  Leg Cramps  (ICD-729.82) 9)  Renal Insufficiency, Acute  (ICD-585.9) 10)  Abdominal Pain, Left Lower Quadrant  (ICD-789.04) 11)  Dysphagia  (ICD-787.29) 12)  Bilious Emesis  (ICD-787.04) 13)  Dizziness  (ICD-780.4) 14)  Nausea  (ICD-787.02) 15)  Hemorrhoids, Internal  (  ICD-455.0) 16)  Diverticulosis, Colon  (ICD-562.10) 17)  Colonic Polyps  (ICD-211.3) 18)  Fungal Dermatitis  (ICD-111.9) 19)  Abdominal Tenderness, Left Lower Quadrant  (ICD-789.64) 20)  Nausea  (ICD-787.02) 21)  Insomnia  (ICD-780.52) 22)  Degenerative Disc Disease, Cervical Spine  (ICD-722.4) 23)  Arthritis, 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) 24)  Postural Lightheadedness  (ICD-780.4) 25)  Low Back Pain  (ICD-724.2) 26)  Otitis Media,  Bilateral  (ICD-382.9) 27)  Prostatitis, Chronic  (ICD-601.1) 28)  Benign Prostatic Hypertrophy  (ICD-600.00) 29)  Hypertension  (ICD-401.9) 30)  Hyperlipidemia  (ICD-272.4)  Medications Prior to Update: 1)  Finasteride 5 Mg Tabs (Finasteride) .... One By Mouth Daily  ( Replaced The Avodart) 2)  Cozaar 100 Mg Tabs (Losartan Potassium) .Marland Kitchen.. 1 Once Daily 3)  Bystolic 5 Mg Tabs (Nebivolol Hcl) .... One By Mouth Daily 4)  Baclofen 10 Mg Tabs (Baclofen) .... Oen By Mouth Q Hs As Needed 5)  Promethazine Hcl 25 Mg Tabs (Promethazine Hcl) .... Take 1/2 To 1 Every 6 Hours As Needed For Nausea 6)  Align  Caps (Probiotic Product) .... Take 1 Cap Daily 7)  Clotrimazole-Betamethasone 1-0.05 % Lotn (Clotrimazole-Betamethasone) .... 4-5 Drops in The Right Ear Two Times A Day 8)  Fluconazole 100 Mg Tabs (Fluconazole) .... One By Mouth Daily For 21 Days  Current Medications (verified): 1)  Finasteride 5 Mg Tabs (Finasteride) .... One By Mouth Daily  ( Replaced The Avodart) 2)  Cozaar 100 Mg Tabs (Losartan Potassium) .Marland Kitchen.. 1 Once Daily 3)  Bystolic 5 Mg Tabs (Nebivolol Hcl) .... One By Mouth Daily 4)  Baclofen 10 Mg Tabs (Baclofen) .... Oen By Mouth Q Hs As Needed 5)  Promethazine Hcl 25 Mg Tabs (Promethazine Hcl) .... Take 1/2 To 1 Every 6 Hours As Needed For Nausea 6)  Align  Caps (Probiotic Product) .... Take 1 Cap Daily 7)  Clotrimazole-Betamethasone 1-0.05 % Lotn (Clotrimazole-Betamethasone) .... 4-5 Drops in The Right Ear Two Times A Day 8)  Rapaflo 8 Mg Caps (Silodosin) .... One By Mouth At Bedtime 9)  Clotrimazole-Betamethasone 1-0.05 % Crea (Clotrimazole-Betamethasone) .... Aplly To Site Two Times A Day  Allergies (verified): 1)  * Fluoxin  Past History:  Family History: Last updated: 01/07/2009 Family History Hypertension Family History Kidney disease Family History of Prostate Cancer: Family History of Colon Polyps:  Social History: Last updated: 01/07/2009 Former Smoker Alcohol  use-no Occupation:Retired Illicit Drug Use - no 2 children  Risk Factors: Smoking Status: quit (01/12/2010) Packs/Day: 1.5-2 packs per day (01/12/2010)  Past medical, surgical, family and social histories (including risk factors) reviewed, and no changes noted (except as noted below).  Past Medical History: Reviewed history from 03/04/2009 and no changes required. Hyperlipidemia Hypertension Benign prostatic hypertrophy Degenerative dics disease L3 L4 stenosis Tublar adenomatous colon polyps 07/2005 Diverticulosis Hemorrhoids  Past Surgical History: Reviewed history from 01/07/2009 and no changes required. Unremarkable  Family History: Reviewed history from 01/07/2009 and no changes required. Family History Hypertension Family History Kidney disease Family History of Prostate Cancer: Family History of Colon Polyps:  Social History: Reviewed history from 01/07/2009 and no changes required. Former Smoker Alcohol use-no Occupation:Retired Illicit Drug Use - no 2 children  Review of Systems  The patient denies anorexia, fever, weight loss, weight gain, vision loss, decreased hearing, hoarseness, chest pain, syncope, dyspnea on exertion, peripheral edema, prolonged cough, headaches, hemoptysis, abdominal pain, melena, hematochezia, severe indigestion/heartburn, hematuria, incontinence, genital sores, muscle weakness, suspicious skin lesions, transient blindness, difficulty walking, depression, unusual weight change, abnormal  bleeding, enlarged lymph nodes, angioedema, and breast masses.    Physical Exam  General:  alert and overweight-appearing.   Head:  normocephalic and atraumatic.   Eyes:  pupils equal and pupils round.   Ears:  R canal inflamed and L canal drainage.   Nose:  no external deformity and no external erythema.   Mouth:  No deformity or lesions, dentition normal. Neck:  Supple; no masses or thyromegaly. Lungs:  Clear throughout to auscultation. Heart:   Regular rate and rhythm; no murmurs, rubs,  or bruits.   Impression & Recommendations:  Problem # 1:  BENIGN PROSTATIC HYPERTROPHY (ICD-600.00)  His updated medication list for this problem includes:    Finasteride 5 Mg Tabs (Finasteride) ..... One by mouth daily  ( replaced the avodart)    Rapaflo 8 Mg Caps (Silodosin) ..... One by mouth at bedtime  PSA: 0.60 (09/18/2006)     Problem # 2:  FUNGAL DERMATITIS (ICD-111.9) could not tolerate the diflican The following medications were removed from the medication list:    Fluconazole 100 Mg Tabs (Fluconazole) ..... One by mouth daily for 21 days His updated medication list for this problem includes:    Clotrimazole-betamethasone 1-0.05 % Lotn (Clotrimazole-betamethasone) .Marland KitchenMarland KitchenMarland KitchenMarland Kitchen 4-5 drops in the right ear two times a day    Clotrimazole-betamethasone 1-0.05 % Crea (Clotrimazole-betamethasone) .Marland Kitchen... Aplly to site two times a day  Problem # 3:  HYPERTENSION (ICD-401.9)  His updated medication list for this problem includes:    Cozaar 100 Mg Tabs (Losartan potassium) .Marland Kitchen... 1 once daily    Bystolic 5 Mg Tabs (Nebivolol hcl) ..... One by mouth daily  BP today: 130/78 Prior BP: 138/80 (11/12/2009)  Prior 10 Yr Risk Heart Disease: 14 % (11/12/2009)  Labs Reviewed: K+: 5.3 (09/08/2009) Creat: : 1.1 (09/08/2009)   Chol: 149 (05/15/2007)   HDL: 24.00 (09/08/2009)   LDL: 98 (05/15/2007)   TG: 167 (05/15/2007)  Problem # 4:  IRRITABLE BOWEL SYNDROME (ICD-564.1)  Problem # 5:  HYPERTENSION (ICD-401.9)  His updated medication list for this problem includes:    Cozaar 100 Mg Tabs (Losartan potassium) .Marland Kitchen... 1 once daily    Bystolic 5 Mg Tabs (Nebivolol hcl) ..... One by mouth daily  Complete Medication List: 1)  Finasteride 5 Mg Tabs (Finasteride) .... One by mouth daily  ( replaced the avodart) 2)  Cozaar 100 Mg Tabs (Losartan potassium) .Marland Kitchen.. 1 once daily 3)  Bystolic 5 Mg Tabs (Nebivolol hcl) .... One by mouth daily 4)  Baclofen 10 Mg  Tabs (Baclofen) .... Oen by mouth q hs as needed 5)  Promethazine Hcl 25 Mg Tabs (Promethazine hcl) .... Take 1/2 to 1 every 6 hours as needed for nausea 6)  Align Caps (Probiotic product) .... Take 1 cap daily 7)  Clotrimazole-betamethasone 1-0.05 % Lotn (Clotrimazole-betamethasone) .... 4-5 drops in the right ear two times a day 8)  Rapaflo 8 Mg Caps (Silodosin) .... One by mouth at bedtime 9)  Clotrimazole-betamethasone 1-0.05 % Crea (Clotrimazole-betamethasone) .... Aplly to site two times a day  Hypertension Assessment/Plan:      The patient's hypertensive risk group is category B: At least one risk factor (excluding diabetes) with no target organ damage.  His calculated 10 year risk of coronary heart disease is 14 %.  Today's blood pressure is 130/78.  His blood pressure goal is < 140/90.  Patient Instructions: 1)  Please schedule a follow-up appointment in 3 months. Prescriptions: CLOTRIMAZOLE-BETAMETHASONE 1-0.05 % CREA (CLOTRIMAZOLE-BETAMETHASONE) aplly to site two times a day  #  60 gm x 3   Entered and Authorized by:   Stacie Glaze MD   Signed by:   Stacie Glaze MD on 01/12/2010   Method used:   Electronically to        [facility]  [phone]* (retail)       [address]       [place], [place]  [address]       Ph: [phone] or [phone]       Fax: [phone]   RxID:   [id number]    Orders Added: 1)  Est. Patient Level IV [36644]

## 2010-03-30 NOTE — Letter (Signed)
[facility]: [facility]   Imported By: Sherian Rein 01/12/2010 13:50:14  _____________________________________________________________________  External Attachment:    Type:   Image     Comment:   External Document

## 2010-03-30 NOTE — Letter (Signed)
Summary: [facility]   Imported By: Maryln Gottron 03/02/2009 14:03:44  _____________________________________________________________________  External Attachment:    Type:   Image     Comment:   External Document

## 2010-03-30 NOTE — Progress Notes (Signed)
Summary: Pt needs to sch ov to see Dr Lovell Sheehan asap per Urologist  Phone Note Call from Patient Call back at [facility] Phone [phone]   Caller: Patient Reason for Call: Acute Illness Summary of Call: Pt called and said that he went to Urologist and they recommended that he make an appt to see Dr. Lovell Sheehan asap. Pls call.  Initial call taken by: Lucy Antigua,  August 06, 2009 2:01 PM  Follow-up for Phone Call        for what? Follow-up by: Willy Eddy, LPN,  August 07, 979 2:11 PM  Additional Follow-up for Phone Call Additional follow up Details #1::        Pt was having pain in lower abdomin and problems urinating. Test were run to check pts bladder and med was prescribed, but pts stomach still hurts and still problems with urination.  Additional Follow-up by: Lucy Antigua,  August 06, 2009 2:23 PM    Additional Follow-up for Phone Call Additional follow up Details #2::    Pt complains of dizziness and neck and head pain.  Is asking to see Dr. Lovell Sheehan for these complaints.  Agrees to see GI for abdominal pain. Follow-up by: Lynann Beaver CMA,  August 06, 2009 4:52 PM  Additional Follow-up for Phone Call Additional follow up Details #3:: Details for Additional Follow-up Action Taken: pleae call pt and give him an appointment for 7/12 on one of the open slots-thanks- explain to him this is the first opening we have  Contacted pt per [facility], LPN and pt was scheduled for appt with Dr Lovell Sheehan on 09/08/2009 at 11:30am..... Adv pt to c/b if condition worsens or changes.Marland KitchenMarland KitchenMarland KitchenPt adv that he would call Dr Anselm Jungling office to set his appt up for GI eval..... Debbra Riding, August 07, 2009 10:58AM   Additional Follow-up by: Willy Eddy, LPN,  August 07, 2009 10:06 AM

## 2010-03-30 NOTE — Progress Notes (Signed)
Summary: URO?  Phone Note Call from Patient   Caller: Patient Call For: Wesley Glaze MD Summary of Call: Pt is having LLQ pain with difficulty emptying his bladder x 2 weeks, and is asking if he should go to his URO.  Advised that this would prob be best with the symptoms he has. Initial call taken by: Lynann Beaver CMA,  August 03, 2009 4:02 PM  Follow-up for Phone Call        dr Lovell Sheehan agrees Follow-up by: Willy Eddy, LPN,  August 03, 1608 4:33 PM

## 2010-03-30 NOTE — Progress Notes (Signed)
Summary: Pt called re: status of referral to [facility]  Phone Note Call from Patient Call back at Home Phone [phone]   Caller: Patient Summary of Call: Pt req status of Neurology referral. Pt has been informed that and order has been faxed to [facility] and we are waiting for a response, but pt id concerned that it has been 2 wks now and nothing has been set up yet. Pls call asap.   Initial call taken by: Lucy Antigua,  September 29, 2009 2:40 PM  Follow-up for Phone Call        Appt Scheduled.  Pt aware. Follow-up by: Corky Mull,  September 29, 2009 2:55 PM

## 2010-03-30 NOTE — Assessment & Plan Note (Signed)
Summary: 2 mo rov/mm   Vital Signs:  Patient profile:   70 year old male Height:      73 inches Weight:      272 pounds BMI:     36.02 Temp:     98.2 degrees F oral Pulse rate:   72 / minute Resp:     14 per minute BP sitting:   126 / 70  (left arm)  Vitals Entered By: Willy Eddy, LPN (April 08, 2009 9:41 AM) CC: roa, Hypertension Management   Primary Care Provider:  Darryll Capers MD  CC:  roa and Hypertension Management.  History of Present Illness: decreased IBS symptoms increased occasonal back pain tolerated med changes well reveiwed GI and GU consults with pt I have spent greater that 30 min face to face evaluating this patient   Hypertension History:      He denies headache, chest pain, palpitations, dyspnea with exertion, orthopnea, PND, peripheral edema, visual symptoms, neurologic problems, syncope, and side effects from treatment.        Positive major cardiovascular risk factors include male age 1 years old or older, hyperlipidemia, and hypertension.  Negative major cardiovascular risk factors include non-tobacco-user status.     Preventive Screening-Counseling & Management  Alcohol-Tobacco     Smoking Status: quit     Packs/Day: 1.5-2 packs per day     Year Started: 1977     Year Quit: 1995  Current Problems (verified): 1)  Irritable Bowel Syndrome  (ICD-564.1) 2)  Personal Hx Colonic Polyps  (ICD-V12.72) 3)  Abdominal Pain, Left Lower Quadrant  (ICD-789.04) 4)  Leg Cramps  (ICD-729.82) 5)  Renal Insufficiency, Acute  (ICD-585.9) 6)  Abdominal Pain, Left Lower Quadrant  (ICD-789.04) 7)  Dysphagia  (ICD-787.29) 8)  Bilious Emesis  (ICD-787.04) 9)  Dizziness  (ICD-780.4) 10)  Nausea  (ICD-787.02) 11)  Hemorrhoids, Internal  (ICD-455.0) 12)  Diverticulosis, Colon  (ICD-562.10) 13)  Colonic Polyps  (ICD-211.3) 14)  Fungal Dermatitis  (ICD-111.9) 15)  Abdominal Tenderness, Left Lower Quadrant  (ICD-789.64) 16)  Nausea  (ICD-787.02) 17)   Insomnia  (ICD-780.52) 18)  Degenerative Disc Disease, Cervical Spine  (ICD-722.4) 19)  Arthritis, 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) 20)  Postural Lightheadedness  (ICD-780.4) 21)  Low Back Pain  (ICD-724.2) 22)  Otitis Media, Bilateral  (ICD-382.9) 23)  Prostatitis, Chronic  (ICD-601.1) 24)  Benign Prostatic Hypertrophy  (ICD-600.00) 25)  Hypertension  (ICD-401.9) 26)  Hyperlipidemia  (ICD-272.4)  Current Medications (verified): 1)  Finasteride 5 Mg Tabs (Finasteride) .... One By Mouth Daily  ( Replaced The Avodart) 2)  Cozaar 100 Mg Tabs (Losartan Potassium) .Marland Kitchen.. 1 Once Daily 3)  Bystolic 5 Mg Tabs (Nebivolol Hcl) .... One By Mouth Daily 4)  Baclofen 10 Mg Tabs (Baclofen) .... Oen By Mouth Q Hs 5)  Dicyclomine Hcl 10 Mg Caps (Dicyclomine Hcl) .... One By Mouth Two To Three Time A Day 6)  Vimovo 500-20 Mg Tbec (Naproxen-Esomeprazole) .... One By Mouth Two Times A Day  Allergies (verified): 1)  * Fluoxin  Past History:  Family History: Last updated: 01/07/2009 Family History Hypertension Family History Kidney disease Family History of Prostate Cancer: Family History of Colon Polyps:  Social History: Last updated: 01/07/2009 Former Smoker Alcohol use-no Occupation:Retired Illicit Drug Use - no 2 children  Risk Factors: Smoking Status: quit (04/08/2009) Packs/Day: 1.5-2 packs per day (04/08/2009)  Past medical, surgical, family and social histories (including risk factors) reviewed for relevance to current acute and chronic problems.  Past  Medical History: Reviewed history from 03/04/2009 and no changes required. Hyperlipidemia Hypertension Benign prostatic hypertrophy Degenerative dics disease L3 L4 stenosis Tublar adenomatous colon polyps 07/2005 Diverticulosis Hemorrhoids  Past Surgical History: Reviewed history from 01/07/2009 and no changes required. Unremarkable  Family History: Reviewed history from 01/07/2009 and no changes required. Family History  Hypertension Family History Kidney disease Family History of Prostate Cancer: Family History of Colon Polyps:  Social History: Reviewed history from 01/07/2009 and no changes required. Former Smoker Alcohol use-no Occupation:Retired Illicit Drug Use - no 2 children  Review of Systems  The patient denies anorexia, fever, weight loss, weight gain, vision loss, decreased hearing, hoarseness, chest pain, syncope, dyspnea on exertion, peripheral edema, prolonged cough, headaches, hemoptysis, abdominal pain, melena, hematochezia, severe indigestion/heartburn, hematuria, incontinence, genital sores, muscle weakness, suspicious skin lesions, transient blindness, difficulty walking, depression, unusual weight change, abnormal bleeding, enlarged lymph nodes, angioedema, breast masses, and testicular masses.    Physical Exam  General:  obese.  alert.   Head:  Normocephalic and atraumatic. Eyes:  anictericpupils equal and pupils round.   Nose:  no external deformity and no external erythema.   Neck:  Supple; no masses or thyromegaly. Lungs:  Clear throughout to auscultation. Heart:  Regular rate and rhythm; no murmurs, rubs,  or bruits. Abdomen:  soft, normal bowel sounds, and distended.   Msk:  no joint tenderness and no joint swelling.   Extremities:  trace left pedal edema and trace right pedal edema.   Neurologic:  alert & oriented X3 and gait normal.     Impression & Recommendations:  Problem # 1:  RENAL INSUFFICIENCY, ACUTE (ICD-585.9) stable Labs Reviewed: BUN: 17 (03/03/2008)   Cr: 1.3 (03/03/2008)    Hgb: 16.9 (11/13/2008)   Hct: 48.9 (11/13/2008)   Ca++: 9.0 (03/03/2008)    TP: 7.2 (02/12/2008)   Alb: 4.2 (02/12/2008)  Problem # 2:  PROSTATITIS, CHRONIC (ICD-601.1) the consult form the urologist discuslt flow issues and doubted infectious prostatitis he was offered rapiflow and it imptoved  Problem # 3:  IRRITABLE BOWEL SYNDROME (ICD-564.1) The pt has mild IBS persistent  symptoms but has siminificant improvement with the bentyl two times a day has instructed the pt to use two times a day to three times a day  Problem # 4:  LOW BACK PAIN (ICD-724.2)  chronic low back pain with prior evaluations ( two level disc dz without surgial option)   His updated medication list for this problem includes:    Baclofen 10 Mg Tabs (Baclofen) ..... Oen by mouth q hs    Vimovo 500-20 Mg Tbec (Naproxen-esomeprazole) ..... One by mouth two times a day  Discussed use of moist heat or ice, modified activities, medications, and stretching/strengthening exercises. Back care instructions given. To be seen in 2 weeks if no improvement; sooner if worsening of symptoms.   Complete Medication List: 1)  Finasteride 5 Mg Tabs (Finasteride) .... One by mouth daily  ( replaced the avodart) 2)  Cozaar 100 Mg Tabs (Losartan potassium) .Marland Kitchen.. 1 once daily 3)  Bystolic 5 Mg Tabs (Nebivolol hcl) .... One by mouth daily 4)  Baclofen 10 Mg Tabs (Baclofen) .... Oen by mouth q hs 5)  Dicyclomine Hcl 10 Mg Caps (Dicyclomine hcl) .... One by mouth two to three time a day 6)  Vimovo 500-20 Mg Tbec (Naproxen-esomeprazole) .... One by mouth two times a day  Hypertension Assessment/Plan:      The patient's hypertensive risk group is category B: At least one risk factor (  excluding diabetes) with no target organ damage.  His calculated 10 year risk of coronary heart disease is 11 %.  Today's blood pressure is 126/70.  His blood pressure goal is < 140/90.  Patient Instructions: 1)  Please schedule a follow-up appointment in 3 months. Prescriptions: DICYCLOMINE HCL 10 MG CAPS (DICYCLOMINE HCL) one by mouth two to three time a day  #270 x 3   Entered and Authorized by:   Stacie Glaze MD   Signed by:   Stacie Glaze MD on 04/08/2009   Method used:   Electronically to        [facility]  [phone]* (retail)       [address]       [place], [place]  [address]        Ph: [phone] or [phone]       Fax: [phone]   RxID:   [id number] VIMOVO 500-20 MG TBEC (NAPROXEN-ESOMEPRAZOLE) one by mouth two times a day  #60 x 3   Entered and Authorized by:   Stacie Glaze MD   Signed by:   Stacie Glaze MD on 04/08/2009   Method used:   Print then Give to Patient   RxID:   [id number]

## 2010-03-30 NOTE — Assessment & Plan Note (Signed)
Summary: DIZZINESS, H/A (OK PER BONNYE, LPN) // RS   Vital Signs:  Patient profile:   70 year old male Height:      73 inches Weight:      264 pounds BMI:     34.96 Temp:     98.2 degrees F oral Pulse rate:   72 / minute Resp:     14 per minute BP sitting:   140 / 80  (left arm) Cuff size:   large  Vitals Entered By: Willy Eddy, LPN (September 08, 2009 12:15 PM) CC: roa- didnt take cipro ordered by GI for diverticulitis for fear of constipation-c/o hip pain, Hypertension Management   Primary Care Provider:  Darryll Capers MD  CC:  roa- didnt take cipro ordered by GI for diverticulitis for fear of constipation-c/o hip pain and Hypertension Management.  History of Present Illness: increased GI  pain and nausea he feels that the pan in his left hip is what causes this nausea he has been referred to a pain specialist for his neck and back pain his sister has fibromyalgia. He was recently dagnosed with early glacoma. ( no drops yet)   Hypertension History:      He denies headache, chest pain, palpitations, dyspnea with exertion, orthopnea, PND, peripheral edema, visual symptoms, neurologic problems, syncope, and side effects from treatment.        Positive major cardiovascular risk factors include male age 16 years old or older, hyperlipidemia, and hypertension.  Negative major cardiovascular risk factors include non-tobacco-user status.     Preventive Screening-Counseling & Management  Alcohol-Tobacco     Smoking Status: quit     Packs/Day: 1.5-2 packs per day     Year Started: 1977     Year Quit: 1995  Problems Prior to Update: 1)  Osteoarthritis  (ICD-715.9) 2)  Neck Pain, Left  (ICD-723.1) 3)  Allergic Rhinitis Due To Other Allergen  (ICD-477.8) 4)  Irritable Bowel Syndrome  (ICD-564.1) 5)  Personal Hx Colonic Polyps  (ICD-V12.72) 6)  Abdominal Pain, Left Lower Quadrant  (ICD-789.04) 7)  Leg Cramps  (ICD-729.82) 8)  Renal Insufficiency, Acute  (ICD-585.9) 9)   Abdominal Pain, Left Lower Quadrant  (ICD-789.04) 10)  Dysphagia  (ICD-787.29) 11)  Bilious Emesis  (ICD-787.04) 12)  Dizziness  (ICD-780.4) 13)  Nausea  (ICD-787.02) 14)  Hemorrhoids, Internal  (ICD-455.0) 15)  Diverticulosis, Colon  (ICD-562.10) 16)  Colonic Polyps  (ICD-211.3) 17)  Fungal Dermatitis  (ICD-111.9) 18)  Abdominal Tenderness, Left Lower Quadrant  (ICD-789.64) 19)  Nausea  (ICD-787.02) 20)  Insomnia  (ICD-780.52) 21)  Degenerative Disc Disease, Cervical Spine  (ICD-722.4) 22)  Arthritis, 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) 23)  Postural Lightheadedness  (ICD-780.4) 24)  Low Back Pain  (ICD-724.2) 25)  Otitis Media, Bilateral  (ICD-382.9) 26)  Prostatitis, Chronic  (ICD-601.1) 27)  Benign Prostatic Hypertrophy  (ICD-600.00) 28)  Hypertension  (ICD-401.9) 29)  Hyperlipidemia  (ICD-272.4)  Current Problems (verified): 1)  Osteoarthritis  (ICD-715.9) 2)  Neck Pain, Left  (ICD-723.1) 3)  Allergic Rhinitis Due To Other Allergen  (ICD-477.8) 4)  Irritable Bowel Syndrome  (ICD-564.1) 5)  Personal Hx Colonic Polyps  (ICD-V12.72) 6)  Abdominal Pain, Left Lower Quadrant  (ICD-789.04) 7)  Leg Cramps  (ICD-729.82) 8)  Renal Insufficiency, Acute  (ICD-585.9) 9)  Abdominal Pain, Left Lower Quadrant  (ICD-789.04) 10)  Dysphagia  (ICD-787.29) 11)  Bilious Emesis  (ICD-787.04) 12)  Dizziness  (ICD-780.4) 13)  Nausea  (ICD-787.02) 14)  Hemorrhoids, Internal  (ICD-455.0) 15)  Diverticulosis, Colon  (ICD-562.10) 16)  Colonic Polyps  (ICD-211.3) 17)  Fungal Dermatitis  (ICD-111.9) 18)  Abdominal Tenderness, Left Lower Quadrant  (ICD-789.64) 19)  Nausea  (ICD-787.02) 20)  Insomnia  (ICD-780.52) 21)  Degenerative Disc Disease, Cervical Spine  (ICD-722.4) 22)  Arthritis, 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) 23)  Postural Lightheadedness  (ICD-780.4) 24)  Low Back Pain  (ICD-724.2) 25)  Otitis Media, Bilateral  (ICD-382.9) 26)  Prostatitis, Chronic  (ICD-601.1) 27)  Benign Prostatic  Hypertrophy  (ICD-600.00) 28)  Hypertension  (ICD-401.9) 29)  Hyperlipidemia  (ICD-272.4)  Medications Prior to Update: 1)  Finasteride 5 Mg Tabs (Finasteride) .... One By Mouth Daily  ( Replaced The Avodart) 2)  Cozaar 100 Mg Tabs (Losartan Potassium) .Marland Kitchen.. 1 Once Daily 3)  Bystolic 5 Mg Tabs (Nebivolol Hcl) .... One By Mouth Daily 4)  Baclofen 10 Mg Tabs (Baclofen) .... Oen By Mouth Q Hs 5)  Cipro 500 Mg Tabs (Ciprofloxacin Hcl) .... Take 1 Tab Twice Daily X 10 Days 6)  Promethazine Hcl 25 Mg Tabs (Promethazine Hcl) .... Take 1/2 To 1 Every 6 Hours As Needed For Nausea 7)  Align  Caps (Probiotic Product) .... Take 1 Cap Daily  Current Medications (verified): 1)  Finasteride 5 Mg Tabs (Finasteride) .... One By Mouth Daily  ( Replaced The Avodart) 2)  Cozaar 100 Mg Tabs (Losartan Potassium) .Marland Kitchen.. 1 Once Daily 3)  Bystolic 5 Mg Tabs (Nebivolol Hcl) .... One By Mouth Daily 4)  Baclofen 10 Mg Tabs (Baclofen) .... Oen By Mouth Q Hs 5)  Promethazine Hcl 25 Mg Tabs (Promethazine Hcl) .... Take 1/2 To 1 Every 6 Hours As Needed For Nausea 6)  Align  Caps (Probiotic Product) .... Take 1 Cap Daily  Allergies (verified): 1)  * Fluoxin  Past History:  Family History: Last updated: 01/07/2009 Family History Hypertension Family History Kidney disease Family History of Prostate Cancer: Family History of Colon Polyps:  Social History: Last updated: 01/07/2009 Former Smoker Alcohol use-no Occupation:Retired Illicit Drug Use - no 2 children  Risk Factors: Smoking Status: quit (09/08/2009) Packs/Day: 1.5-2 packs per day (09/08/2009)  Past medical, surgical, family and social histories (including risk factors) reviewed, and no changes noted (except as noted below).  Past Medical History: Reviewed history from 03/04/2009 and no changes required. Hyperlipidemia Hypertension Benign prostatic hypertrophy Degenerative dics disease L3 L4 stenosis Tublar adenomatous colon polyps  07/2005 Diverticulosis Hemorrhoids  Past Surgical History: Reviewed history from 01/07/2009 and no changes required. Unremarkable  Family History: Reviewed history from 01/07/2009 and no changes required. Family History Hypertension Family History Kidney disease Family History of Prostate Cancer: Family History of Colon Polyps:  Social History: Reviewed history from 01/07/2009 and no changes required. Former Smoker Alcohol use-no Occupation:Retired Illicit Drug Use - no 2 children  Review of Systems  The patient denies anorexia, fever, weight loss, weight gain, vision loss, decreased hearing, hoarseness, chest pain, syncope, dyspnea on exertion, peripheral edema, prolonged cough, headaches, hemoptysis, abdominal pain, melena, hematochezia, severe indigestion/heartburn, hematuria, incontinence, genital sores, muscle weakness, suspicious skin lesions, transient blindness, difficulty walking, depression, unusual weight change, abnormal bleeding, enlarged lymph nodes, angioedema, and breast masses.    Physical Exam  General:  alert and overweight-appearing.   Head:  normocephalic and atraumatic.   Eyes:  pupils equal and pupils round.   Ears:  R ear normal and L ear normal.   Neck:  Supple; no masses or thyromegaly. Lungs:  Clear throughout to auscultation. Heart:  Regular rate and rhythm; no murmurs,  rubs,  or bruits. Abdomen:  soft, normal bowel sounds, and distended.   Msk:  lumbar lordosis and SI joint tenderness.  8/10 tenderness in the left SI joint Extremities:  trace left pedal edema and trace right pedal edema.   Neurologic:  alert & oriented X3 and gait normal.     Impression & Recommendations:  Problem # 1:  IRRITABLE BOWEL SYNDROME (ICD-564.1) Assessment Deteriorated on align and this has helped  Problem # 2:  HYPERTENSION (ICD-401.9) Assessment: Unchanged  His updated medication list for this problem includes:    Cozaar 100 Mg Tabs (Losartan potassium)  .Marland Kitchen... 1 once daily    Bystolic 5 Mg Tabs (Nebivolol hcl) ..... One by mouth daily  BP today: 140/80 Prior BP: 130/80 (08/11/2009)  Prior 10 Yr Risk Heart Disease: 18 % (07/08/2009)  Labs Reviewed: K+: 4.1 (03/03/2008) Creat: : 1.3 (03/03/2008)   Chol: 149 (05/15/2007)   HDL: 17.7 (05/15/2007)   LDL: 98 (05/15/2007)   TG: 167 (05/15/2007)  Orders: Venipuncture (16109) TLB-BMP (Basic Metabolic Panel-BMET) (80048-METABOL)  Problem # 3:  LOW BACK PAIN (ICD-724.2) Assessment: Deteriorated  hx of lumbar injection on left for radicular pain ant both radiology and orthopedist His updated medication list for this problem includes:    Baclofen 10 Mg Tabs (Baclofen) ..... Oen by mouth q hs  Discussed use of moist heat or ice, modified activities, medications, and stretching/strengthening exercises. Back care instructions given. To be seen in 2 weeks if no improvement; sooner if worsening of symptoms.   Orders: T-Lumbar Spine Complete, 5 Views (71110TC)  Complete Medication List: 1)  Finasteride 5 Mg Tabs (Finasteride) .... One by mouth daily  ( replaced the avodart) 2)  Cozaar 100 Mg Tabs (Losartan potassium) .Marland Kitchen.. 1 once daily 3)  Bystolic 5 Mg Tabs (Nebivolol hcl) .... One by mouth daily 4)  Baclofen 10 Mg Tabs (Baclofen) .... Oen by mouth q hs 5)  Promethazine Hcl 25 Mg Tabs (Promethazine hcl) .... Take 1/2 to 1 every 6 hours as needed for nausea 6)  Align Caps (Probiotic product) .... Take 1 cap daily  Other Orders: TLB-Cholesterol, HDL (83718-HDL) TLB-Cholesterol, Direct LDL (83721-DIRLDL) TLB-TSH (Thyroid Stimulating Hormone) (84443-TSH)  Hypertension Assessment/Plan:      The patient's hypertensive risk group is category B: At least one risk factor (excluding diabetes) with no target organ damage.  His calculated 10 year risk of coronary heart disease is 18 %.  Today's blood pressure is 140/80.  His blood pressure goal is < 140/90.  Patient Instructions: 1)  Please schedule a  follow-up appointment in 2 months.

## 2010-03-30 NOTE — Letter (Signed)
Summary: [facility]  [facility]   Imported By: Maryln Gottron 08/11/2009 14:01:30  _____________________________________________________________________  External Attachment:    Type:   Image     Comment:   External Document

## 2010-03-30 NOTE — Consult Note (Signed)
Summary: [facility]  [facility]   Imported By: Maryln Gottron 11/16/2009 12:45:01  _____________________________________________________________________  External Attachment:    Type:   Image     Comment:   External Document

## 2010-03-30 NOTE — Progress Notes (Signed)
Summary: Abd Pain   Phone Note From Other Clinic   Caller: [facility] @ Dr Lovell Sheehan [phone] Call For: Dr Russella Dar Reason for Call: Schedule Patient Appt Summary of Call: Dr Lovell Sheehan requesting we see patient asap for Abd Pain. PA would be ok. Initial call taken by: Leanor Kail [facility],  August 07, 2009 10:02 AM  Follow-up for Phone Call        Please work in with available provider. Follow-up by: Meryl Dare MD Clementeen Graham,  August 07, 2009 11:15 AM  Additional Follow-up for Phone Call Additional follow up Details #1::        Pt. will see [facility] on 08-11-09 at 10:30am. Msg. left for Aurther Loft to advise pt. of appt/med.list/co-pay/cx.policy.  Additional Follow-up by: Laureen Ochs LPN,  August 07, 2009 1:30 PM

## 2010-03-30 NOTE — Assessment & Plan Note (Signed)
Summary: ABD. PAIN            (DR.STARK PT.)           DEBORAH    History of Present Illness Visit Type: Follow-up Visit Primary GI MD: Elie Goody MD [facility] Primary Provider: Darryll Capers MD Requesting Provider: n/a Chief Complaint: Patient has vertigo, nagging sharp pain llq History of Present Illness:   70 Y.O MALE KNOWN TO DR. Russella Dar. HE HAS HX OF DIVERTICULOSIS,COLON POLYPS. HE WAS SEEN IN 1/11 WITH C/O LEFT SIDED ABDOMINAL PAIN  WHICH IMPROVED WITH DICYCLOMINE-PAON WAS FELT TO BE FUNCTIONAL HE ALSO HAS SIGNIFICANT BACK DISEASE AND RADICULOPATHYWAS CONSIDERED AS WELL.  HE COMES IN [place] WITH C/O NAGGING LLQ PAIN OVER THE PAST FEW WEEKS. HE IS ACCOMPANIED BY HIS SON. HE HAS HAD SOME ALTERNATINGBOWEL HABITS-FEELS THAT DICYCLOMINE MAKES HIM CONSTIPATED.Marland Kitchen NO FEVER/CHILLS. GENERALLY FEELS BETTER AFTER MEALS,BUT HAS PERIODS OF NAUSEA, BLOATING. HE WORRIES ABOUT HIS PROTATE AS HAS STRONG FAMILY HX. RECENTLY SEEN BU DR Lovell Sheehan WHO GAVE HIM A COURSE OF FLAGYL AND SEPTRA FOR POSSIBLE DIVERTICULITIS-HE DID NOT FINISH THEM AS CAUSED A HEADACHE. HE ALSO RELATES ONGOING SIGNIFICANT BACK PAIN,NOT SURE WHAT HE SHOULD DO NEXT.   GI Review of Systems    Reports abdominal pain, bloating, and  nausea.     Location of  Abdominal pain: LLQ.    Denies acid reflux, belching, chest pain, dysphagia with liquids, dysphagia with solids, heartburn, loss of appetite, vomiting, vomiting blood, weight loss, and  weight gain.      Reports diarrhea.     Denies anal fissure, black tarry stools, change in bowel habit, constipation, diverticulosis, fecal incontinence, heme positive stool, hemorrhoids, irritable bowel syndrome, jaundice, light color stool, liver problems, rectal bleeding, and  rectal pain.    Current Medications (verified): 1)  Finasteride 5 Mg Tabs (Finasteride) .... One By Mouth Daily  ( Replaced The Avodart) 2)  Cozaar 100 Mg Tabs (Losartan Potassium) .Marland Kitchen.. 1 Once Daily 3)  Bystolic 5 Mg Tabs  (Nebivolol Hcl) .... One By Mouth Daily 4)  Baclofen 10 Mg Tabs (Baclofen) .... Oen By Mouth Q Hs  Allergies (verified): 1)  * Fluoxin  Past History:  Past Medical History: Reviewed history from 03/04/2009 and no changes required. Hyperlipidemia Hypertension Benign prostatic hypertrophy Degenerative dics disease L3 L4 stenosis Tublar adenomatous colon polyps 07/2005 Diverticulosis Hemorrhoids  Past Surgical History: Reviewed history from 01/07/2009 and no changes required. Unremarkable  Family History: Reviewed history from 01/07/2009 and no changes required. Family History Hypertension Family History Kidney disease Family History of Prostate Cancer: Family History of Colon Polyps:  Social History: Reviewed history from 01/07/2009 and no changes required. Former Smoker Alcohol use-no Occupation:Retired Illicit Drug Use - no 2 children  Review of Systems       The patient complains of arthritis/joint pain, back pain, fever, headaches-new, muscle pains/cramps, sleeping problems, swelling of feet/legs, and urination changes/pain.  The patient denies allergy/sinus, anemia, anxiety-new, blood in urine, breast changes/lumps, change in vision, confusion, cough, coughing up blood, depression-new, fainting, fatigue, hearing problems, heart murmur, heart rhythm changes, itching, menstrual pain, night sweats, nosebleeds, pregnancy symptoms, shortness of breath, skin rash, sore throat, swollen lymph glands, thirst - excessive , urination - excessive , urine leakage, vision changes, and voice change.         ROS OTHERWISE AS IN HPI  Vital Signs:  Patient profile:   70 year old male Height:      73 inches Weight:  265.50 pounds BMI:     35.16 Pulse rate:   68 / minute Pulse rhythm:   regular BP sitting:   130 / 80  (left arm) Cuff size:   large  Vitals Entered By: June McMurray CMA Duncan Dull) (August 11, 2009 10:19 AM)  Physical Exam  General:  Well developed, well  nourished, no acute distress. Head:  Normocephalic and atraumatic. Eyes:  PERRLA, no icterus. Lungs:  Clear throughout to auscultation. Heart:  Regular rate and rhythm; no murmurs, rubs,  or bruits. Abdomen:  SOFT, TENDER LLQ,NO REBOUND, NO MASS OR HSM,BS+ Rectal:  NOT DONE Extremities:  No clubbing, cyanosis, edema or deformities noted. Neurologic:  Alert and  oriented x4;  grossly normal neurologically. Psych:  Alert and cooperative. Normal mood and affect.   Impression & Recommendations:  Problem # 1:  ABDOMINAL PAIN, LEFT LOWER QUADRANT (ICD-789.04) Assessment Deteriorated SUSPECT HE DOES HAVE DIVERICULITIS  BY EXAM.  LABS AS BELOW START CIPRO 500 MG TWICE DAILY X 10 DAYS ALIGN ONE DAILY X 30 DAYS USE MIRALAX 17 GM DAILY AS NEEDED. PHENERGAN 12.5 MG  EVERY 6 HOURS AS NEEDED, FOR NAUSEA HE WILL MAKE A FOLLOW UP APPT WITH DR. Vela Prose FOR HIS BACK PAIN FOLLOW UP WITH DR. Russella Dar AS NEEDED,HE WILL CALL IF LLQ PAIN PERSISTS AFTER COMPLETING CIPRO.  Problem # 2:  OSTEOARTHRITIS (ICD-715.9) Assessment: Comment Only DDD,,STENOSIS  Problem # 3:  PERSONAL HX COLONIC POLYPS (ICD-V12.72) Assessment: Comment Only FOLLOW UP COLONOSCOPY DUE 07/2010  Patient Instructions: 1)  We sent prescriptions for Cipro and Phenergan to [facility]. 2)  We have given you Align Probiotic samples. Take 1 daily until gone. 3)  Follow up with Dr. Russella Dar as needed. 4)  Copy sent to : Darryll Capers, MD 5)  The medication list was reviewed and reconciled.  All changed / newly prescribed medications were explained.  A complete medication list was provided to the patient / caregiver. Prescriptions: PROMETHAZINE HCL 25 MG TABS (PROMETHAZINE HCL) Take 1/2 to 1 every 6 hours as needed for nausea  #30 x 1   Entered by:   Lowry Ram NCMA   Authorized by:   Sammuel Cooper PA-c   Signed by:   Lowry Ram NCMA on 08/11/2009   Method used:   Electronically to        [facility]  [phone]*  (retail)       [address]       [place], [place]  [address]       Ph: [phone] or [phone]       Fax: [phone]   RxID:   [id number] CIPRO 500 MG TABS (CIPROFLOXACIN HCL) Take 1 tab twice daily x 10 days  #20 x 0   Entered by:   Lowry Ram NCMA   Authorized by:   Sammuel Cooper PA-c   Signed by:   Lowry Ram NCMA on 08/11/2009   Method used:   Electronically to        [facility]  [phone]* (retail)       [address]       [place], [place]  [address]       Ph: [phone] or [phone]       Fax: [phone]   RxID:   [id number]

## 2010-03-30 NOTE — Letter (Signed)
Summary: [facility]  [facility]   Imported By: Maryln Gottron 09/24/2009 15:52:22  _____________________________________________________________________  External Attachment:    Type:   Image     Comment:   External Document

## 2010-03-30 NOTE — Assessment & Plan Note (Signed)
Summary: 3 month rov/njr   Vital Signs:  Patient profile:   70 year old male Height:      73 inches Weight:      272 pounds BMI:     36.02 Temp:     98.2 degrees F oral Pulse rate:   68 / minute Resp:     14 per minute BP sitting:   140 / 80  (left arm)  Vitals Entered By: Willy Eddy, LPN (Jul 08, 2009 9:51 AM)  Nutrition Counseling: Patient's BMI is greater than 25 and therefore counseled on weight management options. CC: roa- c/o neck pain and earache- to ent and ears were negative- also c/o dizziness-- hasnt  tried vimonva yet, Hypertension Management   Primary Care Provider:  Darryll Capers MD  CC:  roa- c/o neck pain and earache- to ent and ears were negative- also c/o dizziness-- hasnt  tried vimonva yet and Hypertension Management.  History of Present Illness: neck pain felt like his ears were full and had them checked the neck feels tight, the pt has not pain with movement but feels like his eyes are heavy he feels like he does not sleep well but cannot relate this to the neck pain using ear drops for relief   Hypertension History:      He denies headache, chest pain, palpitations, dyspnea with exertion, orthopnea, PND, peripheral edema, visual symptoms, neurologic problems, syncope, and side effects from treatment.        Positive major cardiovascular risk factors include male age 75 years old or older, hyperlipidemia, and hypertension.  Negative major cardiovascular risk factors include non-tobacco-user status.     Preventive Screening-Counseling & Management  Alcohol-Tobacco     Smoking Status: quit     Packs/Day: 1.5-2 packs per day     Year Started: 1977     Year Quit: 1995  Problems Prior to Update: 1)  Irritable Bowel Syndrome  (ICD-564.1) 2)  Personal Hx Colonic Polyps  (ICD-V12.72) 3)  Abdominal Pain, Left Lower Quadrant  (ICD-789.04) 4)  Leg Cramps  (ICD-729.82) 5)  Renal Insufficiency, Acute  (ICD-585.9) 6)  Abdominal Pain, Left Lower  Quadrant  (ICD-789.04) 7)  Dysphagia  (ICD-787.29) 8)  Bilious Emesis  (ICD-787.04) 9)  Dizziness  (ICD-780.4) 10)  Nausea  (ICD-787.02) 11)  Hemorrhoids, Internal  (ICD-455.0) 12)  Diverticulosis, Colon  (ICD-562.10) 13)  Colonic Polyps  (ICD-211.3) 14)  Fungal Dermatitis  (ICD-111.9) 15)  Abdominal Tenderness, Left Lower Quadrant  (ICD-789.64) 16)  Nausea  (ICD-787.02) 17)  Insomnia  (ICD-780.52) 18)  Degenerative Disc Disease, Cervical Spine  (ICD-722.4) 19)  Arthritis, 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) 20)  Postural Lightheadedness  (ICD-780.4) 21)  Low Back Pain  (ICD-724.2) 22)  Otitis Media, Bilateral  (ICD-382.9) 23)  Prostatitis, Chronic  (ICD-601.1) 24)  Benign Prostatic Hypertrophy  (ICD-600.00) 25)  Hypertension  (ICD-401.9) 26)  Hyperlipidemia  (ICD-272.4)  Current Problems (verified): 1)  Irritable Bowel Syndrome  (ICD-564.1) 2)  Personal Hx Colonic Polyps  (ICD-V12.72) 3)  Abdominal Pain, Left Lower Quadrant  (ICD-789.04) 4)  Leg Cramps  (ICD-729.82) 5)  Renal Insufficiency, Acute  (ICD-585.9) 6)  Abdominal Pain, Left Lower Quadrant  (ICD-789.04) 7)  Dysphagia  (ICD-787.29) 8)  Bilious Emesis  (ICD-787.04) 9)  Dizziness  (ICD-780.4) 10)  Nausea  (ICD-787.02) 11)  Hemorrhoids, Internal  (ICD-455.0) 12)  Diverticulosis, Colon  (ICD-562.10) 13)  Colonic Polyps  (ICD-211.3) 14)  Fungal Dermatitis  (ICD-111.9) 15)  Abdominal Tenderness, Left Lower Quadrant  (ICD-789.64) 16)  Nausea  (ICD-787.02) 17)  Insomnia  (ICD-780.52) 18)  Degenerative Disc Disease, Cervical Spine  (ICD-722.4) 19)  Arthritis, 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 One By Mouth Daily  ( Replaced The Avodart) 2)  Cozaar 100 Mg Tabs (Losartan Potassium) .Marland Kitchen.. 1 Once Daily 3)  Bystolic 5 Mg Tabs (Nebivolol Hcl) .... One By Mouth Daily 4)  Baclofen 10 Mg Tabs (Baclofen) .... Oen By Mouth Q Hs 5)  Dicyclomine Hcl 10 Mg Caps (Dicyclomine Hcl) .... One By Mouth Two To Three Time A Day 6)  Vimovo 500-20 Mg Tbec (Naproxen-Esomeprazole) .... One By Mouth Two Times A Day  Current Medications (verified): 1)  Finasteride 5 Mg Tabs (Finasteride) .... One By Mouth Daily  ( Replaced The Avodart) 2)  Cozaar 100 Mg Tabs (Losartan Potassium) .Marland Kitchen.. 1 Once Daily 3)  Bystolic 5 Mg Tabs (Nebivolol Hcl) .... One By Mouth Daily 4)  Baclofen 10 Mg Tabs (Baclofen) .... Oen By Mouth Q Hs 5)  Dicyclomine Hcl 10 Mg Caps (Dicyclomine Hcl) .... One By Mouth Two To Three Time A Day 6)  Vimovo 500-20 Mg Tbec (Naproxen-Esomeprazole) .... One By Mouth Two Times A Day  Allergies (verified): 1)  * Fluoxin  Past History:  Family History: Last updated: 01/07/2009 Family History Hypertension Family History Kidney disease Family History of Prostate Cancer: Family History of Colon Polyps:  Social History: Last updated: 01/07/2009 Former Smoker Alcohol use-no Occupation:Retired Illicit Drug Use - no 2 children  Risk Factors: Smoking Status: quit (07/08/2009) Packs/Day: 1.5-2 packs per day (07/08/2009)  Past medical, surgical, family and social histories (including risk factors) reviewed, and no changes noted (except as noted below).  Past Medical History: Reviewed history from 03/04/2009 and no changes required. Hyperlipidemia Hypertension Benign prostatic hypertrophy Degenerative dics disease L3 L4 stenosis Tublar adenomatous colon polyps 07/2005 Diverticulosis Hemorrhoids  Past Surgical History: Reviewed history from 01/07/2009 and no changes required. Unremarkable  Family History: Reviewed history from 01/07/2009 and no changes required. Family History  Hypertension Family History Kidney disease Family History of Prostate Cancer: Family History of Colon Polyps:  Social History: Reviewed history from 01/07/2009 and no changes required. Former Smoker Alcohol use-no Occupation:Retired Illicit Drug Use - no 2 children  Review of Systems       The patient complains of fever.  The patient denies anorexia, weight loss, weight gain, vision loss, decreased hearing, hoarseness, chest pain, syncope, dyspnea on exertion, peripheral edema, prolonged cough, headaches, hemoptysis, abdominal pain, melena, hematochezia, severe indigestion/heartburn, hematuria, incontinence, genital sores, muscle weakness, suspicious skin lesions, transient blindness, difficulty walking, depression, unusual weight change, abnormal bleeding, enlarged lymph nodes, angioedema, breast masses, and testicular masses.    Physical Exam  General:  obese.  alert.   Head:  Normocephalic and atraumatic. Eyes:  anictericpupils equal and pupils round.   Nose:  no external deformity and no external erythema.   Mouth:  No deformity or lesions, dentition normal. Neck:  Supple; no masses or thyromegaly. Lungs:  Clear throughout to auscultation. Heart:  Regular rate and rhythm; no murmurs, rubs,  or bruits. Abdomen:  soft, normal bowel sounds, and distended.   Msk:  joint tenderness.  mucle spasm and tenderness in  left trap Extremities:  trace left pedal edema and trace right pedal edema.   Neurologic:  alert & oriented X3 and gait normal.     Impression & Recommendations:  Problem # 1:  HYPERTENSION (ICD-401.9)  His updated medication list for this problem includes:    Cozaar 100 Mg Tabs (Losartan potassium) .Marland Kitchen... 1 once daily    Bystolic 5 Mg Tabs (Nebivolol hcl) ..... 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 point injection His updated medication list for this problem includes:    Baclofen 10 Mg Tabs (Baclofen) ..... Oen by mouth q hs    Vimovo 500-20 Mg Tbec (Naproxen-esomeprazole) ..... One by mouth two times a day  Discussed exercises and use of moist heat or cold and medication.   Orders: Trigger Point Injection (1 or 2 muscles) (03474) Depo- Medrol 40mg  (J1030)  Complete Medication List: 1)  Finasteride 5 Mg Tabs (Finasteride) .... One by mouth daily  ( replaced the avodart) 2)  Cozaar 100 Mg Tabs (Losartan potassium) .Marland Kitchen.. 1 once daily 3)  Bystolic 5 Mg Tabs (Nebivolol hcl) .... One by mouth daily 4)  Baclofen 10 Mg Tabs (Baclofen) .... Oen by mouth q hs 5)  Dicyclomine Hcl 10 Mg Caps (Dicyclomine hcl) .... One by mouth two to three time a day 6)  Vimovo 500-20 Mg Tbec (Naproxen-esomeprazole) .... One by mouth two times a day 7)  Nasonex 50 Mcg/act Susp (Mometasone furoate) .... Two spays in each nostril  Hypertension Assessment/Plan:      The patient's hypertensive risk group is category B: At least one risk factor (excluding diabetes) with no target organ damage.  His calculated 10 year risk of coronary heart disease is 18 %.  Today's blood pressure is 140/80.  His blood pressure goal is < 140/90.  Patient Instructions: 1)  Please schedule a follow-up appointment in 3 months. Prescriptions: NASONEX 50 MCG/ACT SUSP (MOMETASONE FUROATE) two spays in each nostril  #1 x 11   Entered and Authorized by:   Stacie Glaze MD   Signed by:   Stacie Glaze MD on 07/08/2009   Method used:    Electronically to        [facility]  [phone]* (retail)       [address]       [place], [place]  [address]       Ph: [phone] or [phone]       Fax: [phone]   RxID:   [id number]

## 2010-04-01 NOTE — Letter (Signed)
Summary: [facility]  [facility]   Imported By: Maryln Gottron 03/08/2010 15:32:44  _____________________________________________________________________  External Attachment:    Type:   Image     Comment:   External Document

## 2010-04-07 NOTE — Letter (Signed)
Summary: [facility]  [facility]   Imported By: Maryln Gottron 04/01/2010 13:05:48  _____________________________________________________________________  External Attachment:    Type:   Image     Comment:   External Document

## 2010-04-18 ENCOUNTER — Ambulatory Visit (INDEPENDENT_AMBULATORY_CARE_PROVIDER_SITE_OTHER): Payer: Medicare Other

## 2010-04-18 ENCOUNTER — Inpatient Hospital Stay (INDEPENDENT_AMBULATORY_CARE_PROVIDER_SITE_OTHER)
Admission: RE | Admit: 2010-04-18 | Discharge: 2010-04-18 | Disposition: A | Discharge status: Home / Self Care | Payer: Medicare Other | Source: Ambulatory Visit | Attending: Family Medicine | Admitting: Family Medicine

## 2010-04-18 DIAGNOSIS — K5900 Constipation, unspecified: Secondary | ICD-10-CM

## 2010-04-18 DIAGNOSIS — M549 Dorsalgia, unspecified: Secondary | ICD-10-CM

## 2010-04-18 DIAGNOSIS — B9789 Other viral agents as the cause of diseases classified elsewhere: Secondary | ICD-10-CM

## 2010-04-18 DIAGNOSIS — G8929 Other chronic pain: Secondary | ICD-10-CM

## 2010-04-19 ENCOUNTER — Telehealth: Payer: Self-pay | Admitting: Internal Medicine

## 2010-04-19 DIAGNOSIS — J069 Acute upper respiratory infection, unspecified: Secondary | ICD-10-CM

## 2010-04-19 MED ORDER — AZITHROMYCIN 250 MG PO TABS: ORAL_TABLET | ORAL | Status: AC

## 2010-04-19 NOTE — Telephone Encounter (Signed)
Pt informed med was called in

## 2010-04-19 NOTE — Telephone Encounter (Signed)
Per dr jenkins- may have a zpack 

## 2010-04-19 NOTE — Telephone Encounter (Signed)
Pt called and is req a med for cough,sneezing. Pt went to urgent care yesterday. Pls call in to [facility] on [address].

## 2010-04-20 ENCOUNTER — Telehealth: Payer: Self-pay | Admitting: Internal Medicine

## 2010-04-20 NOTE — Telephone Encounter (Signed)
Pt called [facility] on [address] and the pharmacist  told pt, that no med has been rcvd for pt. Pls call in today.

## 2010-04-20 NOTE — Telephone Encounter (Signed)
Pt has been notified that med has been called in again, as noted.

## 2010-04-20 NOTE — Telephone Encounter (Signed)
Please tell pt to call pharmacy= ot was sent in yesterday and I left himn a message on his machine

## 2010-04-20 NOTE — Telephone Encounter (Signed)
Pt would like cough med sent to [facility].... Pt has been exp runny nose, dry cough, congestion..... Can't come in for OV due to feeling so bad....  Pt can be reached at [phone].

## 2010-04-20 NOTE — Telephone Encounter (Signed)
Pt advised.

## 2010-04-20 NOTE — Telephone Encounter (Signed)
Please let pt know it was called in again

## 2010-04-23 ENCOUNTER — Telehealth: Payer: Self-pay | Admitting: Internal Medicine

## 2010-04-23 DIAGNOSIS — R05 Cough: Secondary | ICD-10-CM

## 2010-04-23 MED ORDER — HYDROCODONE-HOMATROPINE 5-1.5 MG/5ML PO SYRP: 5.0000 mL | ORAL_SOLUTION | Freq: Four times a day (QID) | ORAL | Status: DC | PRN

## 2010-04-23 NOTE — Telephone Encounter (Signed)
May call  And Hycodan 6 ounces 2 teaspoons Q6 hours when necessary cough.

## 2010-04-23 NOTE — Telephone Encounter (Signed)
Z-pak seems to be helping with other sxs but not cough.... Pt would like to have Rx for cough med sent to [facility]...pt # [phone].

## 2010-04-23 NOTE — Telephone Encounter (Signed)
Pt informed and med called in 

## 2010-04-29 ENCOUNTER — Encounter: Payer: Self-pay | Admitting: Internal Medicine

## 2010-04-29 ENCOUNTER — Ambulatory Visit (INDEPENDENT_AMBULATORY_CARE_PROVIDER_SITE_OTHER): Payer: Medicare Other | Admitting: Internal Medicine

## 2010-04-29 VITALS — BP 130/70 | HR 72 | Temp 98.2°F | Resp 16 | Ht 73.0 in | Wt 256.0 lb

## 2010-04-29 DIAGNOSIS — I10 Essential (primary) hypertension: Secondary | ICD-10-CM

## 2010-04-29 DIAGNOSIS — R05 Cough: Secondary | ICD-10-CM

## 2010-04-29 DIAGNOSIS — J209 Acute bronchitis, unspecified: Secondary | ICD-10-CM

## 2010-04-29 MED ORDER — PREDNISONE (PAK) 10 MG PO TABS: 10.0000 mg | ORAL_TABLET | ORAL | Status: AC

## 2010-04-29 MED ORDER — BENZONATATE 100 MG PO CAPS: 100.0000 mg | ORAL_CAPSULE | Freq: Three times a day (TID) | ORAL | Status: AC | PRN

## 2010-04-29 MED ORDER — MOXIFLOXACIN HCL 400 MG PO TABS: 400.0000 mg | ORAL_TABLET | Freq: Every day | ORAL | Status: AC

## 2010-04-29 MED ORDER — PREDNISONE (PAK) 10 MG PO TABS: 10.0000 mg | ORAL_TABLET | ORAL | Status: DC

## 2010-04-29 NOTE — Progress Notes (Signed)
  Subjective:    Patient ID: Wesley Johnson, male    DOB: 05-25-1940, 70 y.o.   MRN: [id number]  Hypertension This is a chronic problem. The current episode started more than 1 year ago. The problem is unchanged. The problem is controlled. Associated symptoms include shortness of breath. There are no compliance problems.  Identifiable causes of hypertension include sleep apnea.  Cough This is a new problem. The current episode started 1 to 4 weeks ago. The problem has been gradually worsening. The problem occurs every few minutes. The cough is non-productive. Associated symptoms include postnasal drip, rhinorrhea, shortness of breath and wheezing. Pertinent negatives include no chills. The symptoms are aggravated by exercise and dust. He has tried prescription cough suppressant for the symptoms. The treatment provided no relief. His past medical history is significant for bronchitis.    patient presents with a two-week history of upper respiratory tract infection a Z-Pak was called in and it did improve his symptoms moderately about the cough persists and the congestion persists.  He does not have a fever chills nausea or vomiting and cough is nonproductive. He is not short of breath. His comorbidities include hypertension hyperlipidemia with a history of smoking. He quit in 1977   Review of Systems  Constitutional: Positive for fatigue. Negative for chills and activity change.  HENT: Positive for congestion, rhinorrhea, sneezing, postnasal drip and sinus pressure.   Respiratory: Positive for cough, shortness of breath, wheezing and stridor.   Cardiovascular: Negative.   Gastrointestinal: Negative.   Genitourinary: Negative.   Musculoskeletal: Negative.   Skin: Negative.    Past Medical History  Diagnosis Date  . Hyperlipidemia   . Hypertension   . BPH (benign prostatic hyperplasia)   . DJD (degenerative joint disease)   . Lumbar stenosis   . Colon polyps   . Diverticulosis   .  Hemorrhoids    History reviewed. No pertinent past surgical history.  reports that he has quit smoking. He does not have any smokeless tobacco history on file. He reports that he does not drink alcohol or use illicit drugs. family history includes Cancer in an unspecified family member; Colon polyps in an unspecified family member; Hypertension in an unspecified family member; Kidney disease in an unspecified family member; and Prostate cancer in an unspecified family member.        Objective:   Physical Exam  Constitutional: He is oriented to person, place, and time. He appears well-developed and well-nourished.  HENT:  Head: Normocephalic and atraumatic.  Eyes: Conjunctivae are normal. Pupils are equal, round, and reactive to light.  Neck: Normal range of motion. Neck supple.  Cardiovascular: Normal rate and regular rhythm.   Pulmonary/Chest: Effort normal. He has wheezes. He exhibits tenderness.  Abdominal: Soft. Bowel sounds are normal.  Musculoskeletal: Normal range of motion.  Neurological: He is alert and oriented to person, place, and time.  Skin: Skin is warm and dry.          Assessment & Plan:   the patient has a persistent bronchitis with respective for pneumonia would treat him with 10 days of a broader spectrum antibiotic and a prescription cough medicine as well as a short course of prednisone due to the wheezing.

## 2010-06-16 ENCOUNTER — Telehealth: Payer: Self-pay | Admitting: Internal Medicine

## 2010-06-16 ENCOUNTER — Ambulatory Visit (INDEPENDENT_AMBULATORY_CARE_PROVIDER_SITE_OTHER): Payer: Medicare Other | Admitting: Internal Medicine

## 2010-06-16 ENCOUNTER — Encounter: Payer: Self-pay | Admitting: Internal Medicine

## 2010-06-16 DIAGNOSIS — R05 Cough: Secondary | ICD-10-CM

## 2010-06-16 MED ORDER — LEVOFLOXACIN 500 MG PO TABS: 500.0000 mg | ORAL_TABLET | Freq: Every day | ORAL | Status: AC

## 2010-06-16 NOTE — Telephone Encounter (Signed)
levaquin 500mg  po qd x7d if not allergic or interactions

## 2010-06-16 NOTE — Telephone Encounter (Signed)
Pt was in to see Dr Rodena Medin this morning and was told that Dr Rodena Medin was going to send an antibiotic to [facility] on [address]. Pt says that [facility] has not rcvd any med. Pls call in asap.

## 2010-06-16 NOTE — Telephone Encounter (Signed)
Called to notify pt that Dr. Rodena Medin did not send in a rx and did not tell me what he wanted to send to the pharm so I am unable to do so. I did try to contact Dr. Rodena Medin to no avail. Made pt aware that I would take care of it first thing tomorrow morning. Pt was understanding of the situation and is okay with waiting until morning.

## 2010-06-17 NOTE — Telephone Encounter (Signed)
Error. Rx sent by Dr. Rodena Medin

## 2010-06-20 ENCOUNTER — Encounter: Payer: Self-pay | Admitting: Internal Medicine

## 2010-06-20 DIAGNOSIS — R05 Cough: Secondary | ICD-10-CM | POA: Insufficient documentation

## 2010-06-20 DIAGNOSIS — R053 Chronic cough: Secondary | ICD-10-CM | POA: Insufficient documentation

## 2010-06-20 NOTE — Assessment & Plan Note (Signed)
Persistent cough occurring after acute URI. Reportedly normal chest x-ray. Patient concerned of a possible infectious etiology and will provide additional antibiotics. Have discussed however potential for post bronchitic cough. Sample of Advair 100/50 provided with dosing instructions. Patient will rinse mouth with water after using. Also using Flonase only on a p.r.n. Basis. Instructed to use 2 sprays q.h.s. To each nostril.Followup if no improvement or worsening. Consider pulmonary consult if cough is refractory to treatment

## 2010-06-20 NOTE — Progress Notes (Signed)
  Subjective:    Patient ID: ARATH KAIGLER, male    DOB: Mar 02, 1940, 70 y.o.   MRN: [id number]  HPI Patient presents to clinic for evaluation of cough. Patient relates history of persistent nonproductive cough over several weeks. Reportedly seen at urgent care and hadreported normal chest x-ray without infiltrate. Has associated nasal congestion and uses Flonase p.r.n. Was initially seen by primary care physician last month with cough shortness of breath and wheezing. Prescribed Zithromax changed to 10 days of Avelox and was given prednisone but states may have not taken off the prednisone. All the symptoms have improved or resolved with the exception of persistent cough. Denies hemoptysis shortness of breath or wheezing.No exacerbating or alleviating factors.  Reviewed past medical history, medications and allergies    Review of Systems see history of present illness     Objective:   Physical Exam    Physical Exam  Vitals reviewed. Constitutional:  appears well-developed and well-nourished. No distress.  HENT:  Head: Normocephalic and atraumatic.  Right Ear: Tympanic membrane, external ear and ear canal normal.  Left Ear: Tympanic membrane, external ear and ear canal normal.  Nose: Nose normal.  Mouth/Throat: Oropharynx is clear and moist. No oropharyngeal exudate.  Eyes: Conjunctivae and EOM are normal. Pupils are equal, round, and reactive to light. Right eye exhibits no discharge. Left eye exhibits no discharge. No scleral icterus.  Neck: Neck supple. No thyromegaly present.  Cardiovascular: Normal rate, regular rhythm and normal heart sounds.  Exam reveals no gallop and no friction rub.   No murmur heard. Pulmonary/Chest: Effort normal and breath sounds normal. No respiratory distress.  has no wheezes.  has no rales.  Lymphadenopathy:   no cervical adenopathy.  Neurological:  is alert.  Skin: Skin is warm and dry.  not diaphoretic.  Psychiatric: normal mood and affect.       Assessment & Plan:

## 2010-07-02 ENCOUNTER — Other Ambulatory Visit: Payer: Self-pay | Admitting: Internal Medicine

## 2010-07-02 ENCOUNTER — Telehealth: Payer: Self-pay | Admitting: *Deleted

## 2010-07-02 MED ORDER — NEBIVOLOL HCL 5 MG PO TABS: 5.0000 mg | ORAL_TABLET | Freq: Every day | ORAL | Status: DC

## 2010-07-02 NOTE — Telephone Encounter (Signed)
Pt informed

## 2010-07-02 NOTE — Telephone Encounter (Signed)
Pt has 1sample box of Bystolic 5 mg remaining. Pt req more samples or is Dr Lovell Sheehan going to prescribed med? Is so, pls call in to [facility] on [address] [phone]

## 2010-07-02 NOTE — Telephone Encounter (Signed)
Sent to pharmacy 

## 2010-07-07 ENCOUNTER — Telehealth: Payer: Self-pay | Admitting: *Deleted

## 2010-07-07 DIAGNOSIS — R05 Cough: Secondary | ICD-10-CM

## 2010-07-07 NOTE — Telephone Encounter (Signed)
Send for chest xray to [facility] per dr Lovell Sheehan

## 2010-07-07 NOTE — Telephone Encounter (Signed)
Pt. Has had a chronic cough since March, and has seen Dr. Lovell Sheehan, Dr. Ty Hilts, and Urgent Care.  Thinks he had a chest xray in Urgent Care about a month ago.  Dr. Ty Hilts told him to see Dr. Lovell Sheehan if he was not better after his last antibiotic course.

## 2010-07-07 NOTE — Telephone Encounter (Signed)
Pt called back and said that the xray he had done at the urgent care, was not a chest xray. The xray was on his stomach.   Pt still having chronic cough. Pls advise.

## 2010-07-07 NOTE — Telephone Encounter (Signed)
Pt notified and will get the chest xray today or tomorrow.

## 2010-07-08 ENCOUNTER — Ambulatory Visit (INDEPENDENT_AMBULATORY_CARE_PROVIDER_SITE_OTHER)
Admission: RE | Admit: 2010-07-08 | Discharge: 2010-07-08 | Disposition: A | Discharge status: Home / Self Care | Payer: Medicare Other | Source: Ambulatory Visit | Attending: Internal Medicine | Admitting: Internal Medicine

## 2010-07-08 DIAGNOSIS — R059 Cough, unspecified: Secondary | ICD-10-CM

## 2010-07-08 DIAGNOSIS — R05 Cough: Secondary | ICD-10-CM

## 2010-07-12 ENCOUNTER — Telehealth: Payer: Self-pay | Admitting: Internal Medicine

## 2010-07-12 NOTE — Telephone Encounter (Signed)
PLEASE ADVISE.

## 2010-07-12 NOTE — Telephone Encounter (Signed)
Pt req chest xray results.

## 2010-07-13 NOTE — Telephone Encounter (Signed)
Pt called to get xray results. Pls call back today.

## 2010-07-13 NOTE — Telephone Encounter (Signed)
Per dr Lovell Sheehan- xray was stable but if pt continue to c/o coughing may have referral to pulmonary-- talked with pt and he states he only cough occasionally and doesn't want to see a pulmonogist right now

## 2010-07-13 NOTE — Telephone Encounter (Signed)
Per dr Lovell Sheehan- xr ay is stable but if hec ontiues to cough may have referral to [facility]- pt informed and he only coughs occasionaly and will call us if he gets worse./

## 2010-07-29 ENCOUNTER — Other Ambulatory Visit: Payer: Self-pay | Admitting: Physician Assistant

## 2010-09-27 ENCOUNTER — Telehealth: Payer: Self-pay | Admitting: Internal Medicine

## 2010-09-27 ENCOUNTER — Other Ambulatory Visit: Payer: Self-pay | Admitting: Internal Medicine

## 2010-09-27 NOTE — Telephone Encounter (Signed)
Pt called and has been having chest pains and pain in left arm for 4-5 days now. Pt said that he was instructed to go to ED, but pt did not go. Pt called a cardiologist and was told that he would need to get a referral from pcp in order to be seen by cardiologist.

## 2010-09-27 NOTE — Telephone Encounter (Signed)
Spoke with patient and he does not wish to go to the ER right now but if things get worse he will.  He will be waiting on the call about an appointment with cardiology.

## 2010-09-27 NOTE — Telephone Encounter (Signed)
Per dr Lovell Sheehan- may have a referral to cardiologist for chest pain,but dr Lovell Sheehan advises you , to please go to emergency room now!

## 2010-09-28 ENCOUNTER — Other Ambulatory Visit: Payer: Self-pay | Admitting: *Deleted

## 2010-09-28 MED ORDER — NEBIVOLOL HCL 5 MG PO TABS: 5.0000 mg | ORAL_TABLET | Freq: Every day | ORAL | Status: DC

## 2010-09-28 MED ORDER — LOSARTAN POTASSIUM 100 MG PO TABS: 100.0000 mg | ORAL_TABLET | Freq: Every day | ORAL | Status: DC

## 2010-09-30 ENCOUNTER — Encounter: Payer: Self-pay | Admitting: *Deleted

## 2010-09-30 ENCOUNTER — Ambulatory Visit (INDEPENDENT_AMBULATORY_CARE_PROVIDER_SITE_OTHER): Payer: Medicare Other | Admitting: Internal Medicine

## 2010-09-30 ENCOUNTER — Encounter: Payer: Self-pay | Admitting: Internal Medicine

## 2010-09-30 DIAGNOSIS — Z01810 Encounter for preprocedural cardiovascular examination: Secondary | ICD-10-CM

## 2010-09-30 DIAGNOSIS — E785 Hyperlipidemia, unspecified: Secondary | ICD-10-CM

## 2010-09-30 DIAGNOSIS — R079 Chest pain, unspecified: Secondary | ICD-10-CM

## 2010-09-30 DIAGNOSIS — I10 Essential (primary) hypertension: Secondary | ICD-10-CM

## 2010-09-30 MED ORDER — NITROGLYCERIN 0.4 MG SL SUBL: 0.4000 mg | SUBLINGUAL_TABLET | SUBLINGUAL | Status: DC | PRN

## 2010-09-30 MED ORDER — NEBIVOLOL HCL 10 MG PO TABS: 10.0000 mg | ORAL_TABLET | Freq: Every day | ORAL | Status: DC

## 2010-09-30 NOTE — Patient Instructions (Addendum)
Your physician has requested that you have a cardiac catheterization. Cardiac catheterization is used to diagnose and/or treat various heart conditions. Doctors may recommend this procedure for a number of different reasons. The most common reason is to evaluate chest pain. Chest pain can be a symptom of coronary artery disease (CAD), and cardiac catheterization can show whether plaque is narrowing or blocking your heart's arteries. This procedure is also used to evaluate the valves, as well as measure the blood flow and oxygen levels in different parts of your heart. For further information please visit [email or web address] Please follow instruction sheet, as given.  Please return for lab work and instructions for your cardiac tomorrow.  Please increase your Bystolic to 10 mg a day  A prescription for SL Nitroglycerin has been sent into your pharmacy to be used as directed.  Follow up with Dr Johney Frame in 4 weeks

## 2010-09-30 NOTE — Assessment & Plan Note (Signed)
Will get fasting lipids with cath

## 2010-09-30 NOTE — Assessment & Plan Note (Addendum)
Mr Bogert presents today for urgent appointment to evaluate chest pain.  His chest pain has typical features and is worse with exertion.  I am worried about crescendoing angina.  His CAD risk factors are obesity, HTN, HL, and prior tobacco use.   I have recommended hospital admission today with plans for cath in the AM.  Unfortunately, the patient states that he has an appointment with his son tomorrow and declines both hospitalization and cath tomorrow.  He would be willing to proceed with cath on Monday at the earliest.  I have expressed my concern and have strongly recommended against waiting until Monday. Risks, benefits, and alternatives to cath were discussed at length with the patient today.  He understands the risks and will plan to proceed with elective cath at [facility] on Monday. He will take ASA 81mg  daily in the interim.  I have increased bystolic to 10mg  daily today.  I have also given slNTG and instructed him on appropriate usage. He is aware that should his symptoms worsen prior to Monday that he should call 911 immediately. He will need fasting lipids and an echo on Monday as well.

## 2010-09-30 NOTE — Assessment & Plan Note (Signed)
Increase bystolic as above

## 2010-09-30 NOTE — Progress Notes (Signed)
Wesley Johnson is a pleasant 70 y.o. WM patient with a h/o obesity, HTN, and HL who presents for cardiology assessment of chest pain.  He reports being in good health until 09/19/10 when he developed chest pain.  He states that he was walking with his son when he developed pain between his shoulder blades.  He then developed substernal chest tightness.  Since that time, he reports ongoing chest discomfort.  He now finds that with minimal exertion, he develops a "burning" chest pain 7/10.  This discomfort is relieved with cessation of ambulation.  He also reports a "heaviness" of his L arm as well as nausea.   He denies associated SOB, vomiting, jaw pain, palpitations, presyncope, or syncope. He denies prior heart problems.  He has not had a stress test, cath, or other cardiac assessment.  Presently, he is chest pain free.  He reports having mild heaviness earlier today.    Past Medical History  Diagnosis Date  . Hyperlipidemia   . Hypertension   . BPH (benign prostatic hyperplasia)   . DJD (degenerative joint disease)   . Lumbar stenosis   . Colon polyps   . Diverticulosis   . Hemorrhoids    No past surgical history on file.  Current Outpatient Prescriptions  Medication Sig Dispense Refill  . baclofen (LIORESAL) 10 MG tablet Take 10 mg by mouth at bedtime as needed.        . benzonatate (TESSALON) 100 MG capsule Take 100 mg by mouth 3 (three) times daily as needed.        . clotrimazole-betamethasone (LOTRISONE) cream Apply topically 2 (two) times daily.        . finasteride (PROSCAR) 5 MG tablet TAKE ONE TABLET BY MOUTH EVERY DAY (REPALCED THE AVODART)  90 tablet  3  . losartan (COZAAR) 100 MG tablet Take 1 tablet (100 mg total) by mouth daily.  90 tablet  1  . nebivolol (BYSTOLIC) 5 MG tablet Take 1 tablet (5 mg total) by mouth daily.  30 tablet  6  . promethazine (PHENERGAN) 25 MG tablet Take 25 mg by mouth every 6 (six) hours as needed.          Allergies  Allergen Reactions  .  Floxin (Ofloxacin)     History   Social History  . Marital Status: Widowed    Spouse Name: N/A    Number of Children: N/A  . Years of Education: N/A   Occupational History  . retired    Social History Main Topics  . Smoking status: Former Smoker    Quit date: 09/30/1975  . Smokeless tobacco: Not on file   Comment: quit in 1977 smoked 1.5 -2 ppd  . Alcohol Use: No  . Drug Use: No  . Sexually Active: Yes   Other Topics Concern  . Not on file   Social History Narrative   Lives in [place] with son.  Widowed.Pt is retired from a [employer].    Family History  Problem Relation Age of Onset  . Hypertension    . Cancer    . Prostate cancer    . Kidney disease    . Colon polyps      ROS- All systems are reviewed and negative except as per the HPI above  Physical Exam: Filed Vitals:   09/30/10 1638  BP: 134/84  Pulse: 68  Height: 6\' 1"  (1.854 m)  Weight: 260 lb (117.935 kg)    GEN- The patient is overweight appearing, alert and  oriented x 3 today.   Head- normocephalic, atraumatic Eyes-  Sclera clear, conjunctiva pink Ears- hearing intact Oropharynx- clear Neck- supple, no JVP Lymph- no cervical lymphadenopathy Lungs- Clear to ausculation bilaterally, normal work of breathing Heart- Regular rate and rhythm, no murmurs, rubs or gallops, PMI not laterally displaced GI- soft, NT, ND, + BS Extremities- no clubbing, cyanosis, or edema MS- no significant deformity or atrophy Skin- no rash or lesion Psych- euthymic mood, full affect Neuro- strength and sensation are intact  EKG today reveals sinus rhythm 68 bpm, PR 180, QRS 100, QTc 418, septal infarction, LAD, LVH  Assessment and Plan:

## 2010-10-04 ENCOUNTER — Other Ambulatory Visit: Payer: Self-pay | Admitting: *Deleted

## 2010-10-04 ENCOUNTER — Encounter: Payer: Self-pay | Admitting: *Deleted

## 2010-10-04 ENCOUNTER — Ambulatory Visit (HOSPITAL_COMMUNITY)
Admission: RE | Admit: 2010-10-04 | Discharge: 2010-10-05 | Disposition: A | Discharge status: Home / Self Care | Payer: Medicare Other | Source: Ambulatory Visit | Attending: Cardiovascular Disease | Admitting: Cardiovascular Disease

## 2010-10-04 DIAGNOSIS — E669 Obesity, unspecified: Secondary | ICD-10-CM | POA: Insufficient documentation

## 2010-10-04 DIAGNOSIS — I2510 Atherosclerotic heart disease of native coronary artery without angina pectoris: Secondary | ICD-10-CM | POA: Insufficient documentation

## 2010-10-04 DIAGNOSIS — I200 Unstable angina: Secondary | ICD-10-CM | POA: Insufficient documentation

## 2010-10-04 DIAGNOSIS — I10 Essential (primary) hypertension: Secondary | ICD-10-CM | POA: Insufficient documentation

## 2010-10-04 DIAGNOSIS — Z01812 Encounter for preprocedural laboratory examination: Secondary | ICD-10-CM | POA: Insufficient documentation

## 2010-10-04 DIAGNOSIS — Z79899 Other long term (current) drug therapy: Secondary | ICD-10-CM | POA: Insufficient documentation

## 2010-10-04 DIAGNOSIS — D509 Iron deficiency anemia, unspecified: Secondary | ICD-10-CM | POA: Insufficient documentation

## 2010-10-04 DIAGNOSIS — N400 Enlarged prostate without lower urinary tract symptoms: Secondary | ICD-10-CM | POA: Insufficient documentation

## 2010-10-04 DIAGNOSIS — R42 Dizziness and giddiness: Secondary | ICD-10-CM | POA: Insufficient documentation

## 2010-10-04 DIAGNOSIS — Z01810 Encounter for preprocedural cardiovascular examination: Secondary | ICD-10-CM | POA: Insufficient documentation

## 2010-10-04 DIAGNOSIS — E785 Hyperlipidemia, unspecified: Secondary | ICD-10-CM | POA: Insufficient documentation

## 2010-10-04 HISTORY — DX: Atherosclerotic heart disease of native coronary artery without angina pectoris: I25.10

## 2010-10-04 HISTORY — PX: CORONARY ANGIOPLASTY WITH STENT PLACEMENT: SHX49

## 2010-10-04 LAB — CBC
Hemoglobin: 16.1 g/dL (ref 13.0–17.0)
MCH: 31.4 pg (ref 26.0–34.0)
Platelets: 184 10*3/uL (ref 150–400)
RBC: 5.13 MIL/uL (ref 4.22–5.81)
WBC: 7.8 10*3/uL (ref 4.0–10.5)

## 2010-10-04 LAB — POCT ACTIVATED CLOTTING TIME: Activated Clotting Time: 507 seconds

## 2010-10-04 LAB — BASIC METABOLIC PANEL
CO2: 27 mEq/L (ref 19–32)
Chloride: 103 mEq/L (ref 96–112)
Creatinine, Ser: 0.87 mg/dL (ref 0.50–1.35)
GFR calc Af Amer: >60 mL/min (ref >60)
Potassium: 4 mEq/L (ref 3.5–5.1)
Sodium: 139 mEq/L (ref 135–145)

## 2010-10-04 LAB — PROTIME-INR
INR: 1.02 (ref 0.00–1.49)
Prothrombin Time: 13.6 seconds (ref 11.6–15.2)

## 2010-10-04 LAB — APTT: aPTT: 31 seconds (ref 24–37)

## 2010-10-05 LAB — BASIC METABOLIC PANEL
BUN: 15 mg/dL (ref 6–23)
Chloride: 103 mEq/L (ref 96–112)
GFR calc non Af Amer: >60 mL/min (ref >60)
Glucose, Bld: 98 mg/dL (ref 70–99)
Potassium: 4.5 mEq/L (ref 3.5–5.1)
Sodium: 139 mEq/L (ref 135–145)

## 2010-10-05 LAB — CBC
HCT: 42.9 % (ref 39.0–52.0)
Hemoglobin: 15.4 g/dL (ref 13.0–17.0)
MCHC: 35.9 g/dL (ref 30.0–36.0)
RBC: 4.8 MIL/uL (ref 4.22–5.81)
WBC: 9 10*3/uL (ref 4.0–10.5)

## 2010-10-08 ENCOUNTER — Telehealth: Payer: Self-pay | Admitting: Cardiovascular Disease

## 2010-10-08 NOTE — Telephone Encounter (Signed)
Patient son calling. Pt stated last night he had some chest pain. Pt son aware that nurse is not in office today.

## 2010-10-08 NOTE — Telephone Encounter (Signed)
Patient's son states that when his father rests, he is having chest pain but this disappears with activity. Wesley Johnson denies CP currently. Informed patient to try taking his Nitroglycerine SL next time this happens to see if this relieves the pain and to notify us if his CP continues. I informed him that he is probably sore from his cardiac catheterization since the medication seems to improve with activity. His radial cath site looks good.

## 2010-10-08 NOTE — Telephone Encounter (Signed)
Son called and was asking about the exercise recommendations. Had surgery on Monday.  Please call back on go over instructions.  Pt stated last night he had some chest pain.  No nitro was taken.

## 2010-10-11 ENCOUNTER — Telehealth: Payer: Self-pay | Admitting: Internal Medicine

## 2010-10-11 NOTE — Telephone Encounter (Signed)
Pt wants to know if he can get a shot in his back for arthritis by his pcp doctor.

## 2010-10-11 NOTE — Telephone Encounter (Signed)
Given dual antiplatelet therapy s/p recent PCI, he is at increased risks for bleeding with procedures.  He should not interrupt antiplatelet therapy for 12 months. I would advise against epidural procedures due to bleeding risks, though other procedures may be reasonable.  I would need to know more about the planned procedure to make further recommendations.

## 2010-10-12 NOTE — Cardiovascular Report (Signed)
Wesley Johnson, Wesley Johnson             ACCOUNT NO.:  [id number]  MEDICAL RECORD NO.:  [id number]  LOCATION:  6531                         FACILITY:  [facility]  PHYSICIAN:  Veverly Fells. Excell Seltzer, MD  DATE OF BIRTH:  1940/05/16  DATE OF PROCEDURE:  10/04/2010 DATE OF DISCHARGE:                           CARDIAC CATHETERIZATION   PROCEDURES: 1. Left heart catheterization. 2. Selective coronary angiography. 3. Left ventricular angiography. 4. Percutaneous transluminal coronary angioplasty and stenting of the     ramus intermedius.  PROCEDURAL INDICATIONS:  Wesley Johnson is a 70 year old gentleman with obesity, hypertension, and hyperlipidemia who presented with symptoms typical of crescendo angina.  He was referred directly for cardiac catheterization and presented today as an outpatient.  He has no documented history of coronary artery disease.  Because of his classic symptoms and multiple comorbid cardiovascular risk factors, he was referred directly for cath rather than noninvasive testing.  Risks and indications of the procedure were reviewed with the patient. Informed consent was obtained.  The right wrist was prepped, draped, and anesthetized with 1% lidocaine.  Using modified Seldinger technique, a 5- French sheath was placed in the right radial artery.  3 mg of verapamil was given through the sheath.  4000 units of unfractionated heparin was administered intravenously.  A TIG catheter was used for selective coronary angiography.  Pigtail catheter was used for left ventriculography.  Following the diagnostic procedure, a PCI was performed.  DIAGNOSTIC FINDINGS:  Aortic pressure is 143/74 with a mean of 103. Left ventricular pressure is 139/14.  CORONARY ANGIOGRAPHY:  The right coronary artery is patent.  It is a dominant vessel with no obstructive disease.  There are diffuse luminal irregularities, but no significant stenoses are identified.  There is a large right PDA branch  present.  There is a very small posterolateral branch present.  Left coronary artery:  There is mild nonobstructive left main stenosis of about 20-30% just before the bifurcation of the LAD and left circumflex.  LAD:  The LAD is a large-caliber vessel.  The proximal LAD is widely patent.  The LAD just after the diagonal origin has a 40-50% stenosis. The mid and distal LAD beyond the diagonal has no significant disease.  The first diagonal/ramus intermedius branch has a critical mid stenosis. There is a 90% lesion into an inferior branch.  The superior branch is subtotally occluded with TIMI 2 flow and 100% stenosis at the ostium of that branch vessel.  Left circumflex:  The left circumflex has 30-40% ostial stenosis.  There is a large first OM with no significant stenosis.  The AV groove circumflex beyond the OM branch has mild nonobstructive disease in the Johnson of 30-40%.  The circumflex courses down that gave off 2 posterolateral branches.  Left ventriculography shows normal LV systolic function.  Ejection fraction is estimated at 60%.  There is no significant mitral regurgitation.  As noted above, PCI was performed.  The radial sheath was up-sized to a 6-French.  Bivalirudin was used for anticoagulation.  Once a therapeutic ACT was achieved, the inferior ramus branch was wired with a Cougar guidewire.  A second wire was advanced across the total occlusion of the superior branch.  The superior  branch was initially dilated with a 2.0 x 15 mm Emerge balloon which was taken to 8 atmospheres.  A 2.5 x 15 mm Emerge balloon was used to dilate the bigger, inferior branch to 8 atmospheres and then 10 atmospheres on a second inflation.  I then pulled the wire out of the superior branch which had regained TIMI 3 flow.  The inferior, larger branch was stented across the superior branch with a 2.75 x 20 mm Promus Element drug-eluting stent.  The stent was carefully positioned and deployed  at 12 atmospheres.  The stent was then postdilated with a 3.0 x 15 mm Diagonal Quantum apex to 16 atmospheres. The superior branch continued to have TIMI 3 flow after stenting, but there was significant high-grade ostial narrowing.  The vessel was rewired and a final kissing balloon inflation was done with 3.0 x 15 mm [place] Quantum apex in the inferior branch taken 10 atmospheres and a 2.5 x 15 mm Emerge balloon taken to 6 atmospheres on 2 inflations.  At the completion of the procedure, there was an excellent result.  There was 0% residual stenosis in the main branch with TIMI 3 flow and the superior branch vessel had 30% residual stenosis also with TIMI 3 flow. The patient tolerated the entire procedure well.  There were no immediate complications.  Of note, he was given Plavix 600 mg at the completion of the procedure.  He could not swallow pills lying down and we will therefore continue his Angiomax at a reduced dose for another 2 hours.  FINAL CONCLUSIONS: 1. Severe single-vessel coronary artery disease with successful     complex PCI of the ramus intermedius branch. 2. Mild LAD stenosis. 3. Mild nonobstructive left circumflex stenosis. 4. Patent, dominant right coronary artery. 5. Preserved LV function.  RECOMMENDATIONS:  The patient should be maintained on dual antiplatelet therapy with aspirin and Plavix for minimum of 12 months.     Veverly Fells. Excell Seltzer, MD     MDC/MEDQ  D:  10/04/2010  T:  10/05/2010  Job:  [id number]  cc:   Wesley Range, MD Stacie Glaze, MD  Electronically Signed by Tonny Bollman MD on 10/12/2010 05:23:49 AM

## 2010-10-12 NOTE — Discharge Summary (Signed)
  NAMEKEAVON, SENSING             ACCOUNT NO.:  [id number]  MEDICAL RECORD NO.:  [id number]  LOCATION:  6531                         FACILITY:  [facility]  PHYSICIAN:  Veverly Fells. Excell Seltzer, MD  DATE OF BIRTH:  Nov 17, 1940  DATE OF ADMISSION:  10/04/2010 DATE OF DISCHARGE:  10/05/2010                              DISCHARGE SUMMARY   FINAL DIAGNOSIS:  Class III angina with severe single-vessel coronary artery disease.  SECONDARY DIAGNOSES: 1. Hypochromia. 2. Hyperlipidemia. 3. Hypertension. 4. Vertigo. 5. Benign prostatic hypertrophy.  PROCEDURES PERFORMED:  Cardiac catheterization on October 04, 2010, showed severe single vessel coronary artery disease of the ramus intermedius branch.  The patient was treated with a drug-eluting stent.  He was noted to have preserved left ventricular systolic function with an ejection fraction of 60%.  The procedure was uncomplicated.  HOSPITAL COURSE:  Mr. Winterton is a 70 year old gentleman with multiple cardiovascular risk factors who presented with crescendo angina.  He was referred directly for cardiac catheterization because of high pretest probability for obstructive CAD.  His catheterization procedure was performed from a right radial approach.  This demonstrated severe single- vessel coronary artery disease of the ramus intermedius branch.  The procedure was complex as it involved a bifurcation point and both branches of the intermediate bifurcation were treated (main branch treated with stenting, subbranch treated with PTCA).  The patient tolerated the procedure well.  He did have significant vertigo with associated nausea and vomiting following the procedure.  This has been a chronic problem for the patient of greater than 20 years.  He, otherwise, did not have any neurologic symptoms and again this symptom has occurred on an intermittent basis for many years.  He was treated with IV Zofran and oral meclizine with some improvement.  He will  follow up with his ENT physician as an outpatient.  The day following his PCI procedure, the patient had no chest pain or dyspnea.  His hemodynamics were stable.  He was deemed stable for discharge.  DISCHARGE MEDICATIONS: 1. Aspirin 81 mg daily. 2. Plavix 75 mg daily. 3. Proscar 5 mg daily. 4. Losartan 100 mg daily. 5. Bystolic 10 mg daily. 6. Meclizine 25 mg as needed. 7. Phenergan 25 mg orally as needed.  FOLLOWUP:  The patient will follow up with Dr. Johney Frame in 3-4 weeks.  He will also follow up with Dr. Lovell Sheehan who is his primary care physician to see if he needs further evaluation and treatment of his vertigo.  DISCHARGE CONDITION:  Stable.     Veverly Fells. Excell Seltzer, MD     MDC/MEDQ  D:  10/05/2010  T:  10/05/2010  Job:  [id number]  cc:   Hillis Range, MD Stacie Glaze, MD  Electronically Signed by Tonny Bollman MD on 10/12/2010 05:23:45 AM

## 2010-11-10 ENCOUNTER — Encounter: Payer: Self-pay | Admitting: Internal Medicine

## 2010-11-10 ENCOUNTER — Ambulatory Visit (INDEPENDENT_AMBULATORY_CARE_PROVIDER_SITE_OTHER): Payer: Medicare Other | Admitting: Internal Medicine

## 2010-11-10 VITALS — BP 126/68 | HR 64 | Ht 73.0 in | Wt 251.0 lb

## 2010-11-10 DIAGNOSIS — E785 Hyperlipidemia, unspecified: Secondary | ICD-10-CM

## 2010-11-10 DIAGNOSIS — I10 Essential (primary) hypertension: Secondary | ICD-10-CM

## 2010-11-10 DIAGNOSIS — I2510 Atherosclerotic heart disease of native coronary artery without angina pectoris: Secondary | ICD-10-CM

## 2010-11-10 NOTE — Patient Instructions (Signed)
Your physician recommends that you schedule a follow-up appointment in 3 months with Dr Allred    

## 2010-11-15 ENCOUNTER — Encounter: Payer: Self-pay | Admitting: Internal Medicine

## 2010-11-15 NOTE — Assessment & Plan Note (Signed)
Stable No change required today  

## 2010-11-15 NOTE — Assessment & Plan Note (Signed)
Doing well s/p PCI of the Ramus Continue aggressive medical therapy

## 2010-11-15 NOTE — Progress Notes (Signed)
The patient presents today for routine cardiology followup.  Since recent PCI of his Ramus, the patient reports doing very well.  His chest pain and shortness of breath have resolved.  His exercise tolerance has improved.  He is presently walking 1 mile each day.  Today, he denies symptoms of palpitations, orthopnea, PND, lower extremity edema, dizziness, presyncope, syncope, or neurologic sequela.  The patient feels that he is tolerating medications without difficulties and is otherwise without complaint today.   Past Medical History  Diagnosis Date  . Hyperlipidemia   . Hypertension   . BPH (benign prostatic hyperplasia)   . DJD (degenerative joint disease)   . Lumbar stenosis   . Colon polyps   . Diverticulosis   . Hemorrhoids   . CAD (coronary artery disease) 10/04/10    s/p PCI of the Ramus, nonobstructive disease of LAD, LCx, and RCA   Past Surgical History  Procedure Date  . Angioplasty 10/04/10    PCI Ramus with Promus DES    Current Outpatient Prescriptions  Medication Sig Dispense Refill  . alclomethasone (ACLOVATE) 0.05 % cream as needed.      Marland Kitchen aspirin 81 MG chewable tablet Chew 81 mg by mouth daily.        . baclofen (LIORESAL) 10 MG tablet Take 10 mg by mouth at bedtime as needed.        . clopidogrel (PLAVIX) 75 MG tablet Take 1 tablet by mouth Daily.        . finasteride (PROSCAR) 5 MG tablet TAKE ONE TABLET BY MOUTH EVERY DAY (REPALCED THE AVODART)  90 tablet  3  . losartan (COZAAR) 100 MG tablet Take 1 tablet (100 mg total) by mouth daily.  90 tablet  1  . nebivolol (BYSTOLIC) 10 MG tablet Take 1 tablet (10 mg total) by mouth daily.  30 tablet  6  . nitroGLYCERIN (NITROSTAT) 0.4 MG SL tablet Place 1 tablet (0.4 mg total) under the tongue every 5 (five) minutes as needed for chest pain.  25 tablet  12  . pravastatin (PRAVACHOL) 40 MG tablet Take 1 tablet by mouth Daily.      . promethazine (PHENERGAN) 25 MG tablet Take 25 mg by mouth every 6 (six) hours as needed.            Allergies  Allergen Reactions  . Floxin (Ofloxacin)     History   Social History  . Marital Status: Widowed    Spouse Name: N/A    Number of Children: N/A  . Years of Education: N/A   Occupational History  . retired    Social History Main Topics  . Smoking status: Former Smoker    Quit date: 09/30/1975  . Smokeless tobacco: Not on file   Comment: quit in 1977 smoked 1.5 -2 ppd  . Alcohol Use: No  . Drug Use: No  . Sexually Active: Yes   Other Topics Concern  . Not on file   Social History Narrative   Lives in [place] with son.  Widowed.Pt is retired from a [employer].    Family History  Problem Relation Age of Onset  . Hypertension    . Cancer    . Prostate cancer    . Kidney disease    . Colon polyps      ROS-  All systems are reviewed and are negative except as outlined in the HPI above  Physical Exam: Filed Vitals:   11/10/10 1429  BP: 126/68  Pulse: 64  Height:  6\' 1"  (1.854 m)  Weight: 251 lb (113.853 kg)    GEN- The patient is well appearing, alert and oriented x 3 today.   Head- normocephalic, atraumatic Eyes-  Sclera clear, conjunctiva pink Ears- hearing intact Oropharynx- clear Neck- supple, no JVP Lymph- no cervical lymphadenopathy Lungs- Clear to ausculation bilaterally, normal work of breathing Heart- Regular rate and rhythm, no murmurs, rubs or gallops, PMI not laterally displaced GI- soft, NT, ND, + BS Extremities- no clubbing, cyanosis, or edema MS- no significant deformity or atrophy Skin- no rash or lesion Psych- euthymic mood, full affect Neuro- strength and sensation are intact  ekg today reveals sinus rhythm 64 bpm, septal infarct, diffuse J point elevation, otherwise normal ekg  Assessment and Plan:

## 2010-12-03 LAB — COMPREHENSIVE METABOLIC PANEL
ALT: 20 U/L (ref 0–53)
Alkaline Phosphatase: 54 U/L (ref 39–117)
BUN: 23 mg/dL (ref 6–23)
CO2: 24 mEq/L (ref 19–32)
Calcium: 9.3 mg/dL (ref 8.4–10.5)
GFR calc non Af Amer: 33 mL/min — ABNORMAL LOW (ref >60)
Glucose, Bld: 131 mg/dL — ABNORMAL HIGH (ref 70–99)
Potassium: 4.5 mEq/L (ref 3.5–5.1)
Sodium: 135 mEq/L (ref 135–145)

## 2010-12-03 LAB — URINALYSIS, ROUTINE W REFLEX MICROSCOPIC
Bilirubin Urine: NEGATIVE
Ketones, ur: NEGATIVE mg/dL
Nitrite: NEGATIVE
Protein, ur: 30 mg/dL — AB
Urobilinogen, UA: 0.2 mg/dL (ref 0.0–1.0)

## 2010-12-03 LAB — DIFFERENTIAL
Basophils Relative: 1 % (ref 0–1)
Eosinophils Absolute: 0.1 10*3/uL (ref 0.0–0.7)
Neutro Abs: 9.1 10*3/uL — ABNORMAL HIGH (ref 1.7–7.7)
Neutrophils Relative %: 78 % — ABNORMAL HIGH (ref 43–77)

## 2010-12-03 LAB — CBC
HCT: 47.7 % (ref 39.0–52.0)
Hemoglobin: 16.3 g/dL (ref 13.0–17.0)
MCHC: 34.2 g/dL (ref 30.0–36.0)
RBC: 5.01 MIL/uL (ref 4.22–5.81)
RDW: 12.9 % (ref 11.5–15.5)

## 2010-12-03 LAB — SEDIMENTATION RATE: Sed Rate: 5 mm/hr (ref 0–16)

## 2010-12-03 LAB — LIPASE, BLOOD: Lipase: 30 U/L (ref 11–59)

## 2010-12-08 ENCOUNTER — Other Ambulatory Visit: Payer: Self-pay | Admitting: Internal Medicine

## 2011-02-14 ENCOUNTER — Encounter: Payer: Self-pay | Admitting: Internal Medicine

## 2011-02-14 ENCOUNTER — Ambulatory Visit (INDEPENDENT_AMBULATORY_CARE_PROVIDER_SITE_OTHER): Payer: Medicare Other | Admitting: Internal Medicine

## 2011-02-14 DIAGNOSIS — E785 Hyperlipidemia, unspecified: Secondary | ICD-10-CM

## 2011-02-14 DIAGNOSIS — I2510 Atherosclerotic heart disease of native coronary artery without angina pectoris: Secondary | ICD-10-CM

## 2011-02-14 DIAGNOSIS — I10 Essential (primary) hypertension: Secondary | ICD-10-CM

## 2011-02-14 LAB — HEPATIC FUNCTION PANEL
AST: 22 U/L (ref 0–37)
Albumin: 4.3 g/dL (ref 3.5–5.2)

## 2011-02-14 LAB — LIPID PANEL
HDL: 28.5 mg/dL — ABNORMAL LOW (ref >39.00)
LDL Cholesterol: 48 mg/dL (ref 0–99)
Total CHOL/HDL Ratio: 4
Triglycerides: 182 mg/dL — ABNORMAL HIGH (ref 0.0–149.0)
VLDL: 36.4 mg/dL (ref 0.0–40.0)

## 2011-02-14 MED ORDER — CLOPIDOGREL BISULFATE 75 MG PO TABS: 75.0000 mg | ORAL_TABLET | Freq: Every day | ORAL | Status: DC

## 2011-02-14 MED ORDER — PRAVASTATIN SODIUM 40 MG PO TABS: 40.0000 mg | ORAL_TABLET | Freq: Every day | ORAL | Status: DC

## 2011-02-14 NOTE — Assessment & Plan Note (Signed)
Fasting lipids today LFTs today

## 2011-02-14 NOTE — Patient Instructions (Signed)
Your physician wants you to follow-up in: 6 months with Dr Jacquiline Doe will receive a reminder letter in the mail two months in advance. If you don't receive a letter, please call our office to schedule the follow-up appointment.  Your physician recommends that you return for lab work today  Lipid/liver

## 2011-02-14 NOTE — Progress Notes (Signed)
The patient presents today for routine cardiology followup.  Since his last visit, the patient reports doing very well.  His chest pain and shortness of breath have resolved.  His exercise tolerance has improved.  He is presently walking 1-2.5 mile most days without difficult.  He is walking less often with recent worsening of cold weather.  Today, he denies symptoms of palpitations, orthopnea, PND, lower extremity edema, dizziness, presyncope, syncope, or neurologic sequela.  The patient feels that he is tolerating medications without difficulties and is otherwise without complaint today.   Past Medical History  Diagnosis Date  . Hyperlipidemia   . Hypertension   . BPH (benign prostatic hyperplasia)   . DJD (degenerative joint disease)   . Lumbar stenosis   . Colon polyps   . Diverticulosis   . Hemorrhoids   . CAD (coronary artery disease) 10/04/10    s/p PCI of the Ramus, nonobstructive disease of LAD, LCx, and RCA   Past Surgical History  Procedure Date  . Angioplasty 10/04/10    PCI Ramus with Promus DES    Current Outpatient Prescriptions  Medication Sig Dispense Refill  . alclomethasone (ACLOVATE) 0.05 % cream as needed.      Marland Kitchen aspirin 81 MG chewable tablet Chew 81 mg by mouth daily.        . baclofen (LIORESAL) 10 MG tablet Take 10 mg by mouth at bedtime as needed.        . clopidogrel (PLAVIX) 75 MG tablet Take 1 tablet (75 mg total) by mouth daily.  90 tablet  1  . finasteride (PROSCAR) 5 MG tablet TAKE ONE TABLET BY MOUTH EVERY DAY (REPALCED THE AVODART)  90 tablet  3  . losartan (COZAAR) 100 MG tablet Take 1 tablet (100 mg total) by mouth daily.  90 tablet  1  . nebivolol (BYSTOLIC) 10 MG tablet Take 1 tablet (10 mg total) by mouth daily.  30 tablet  6  . nitroGLYCERIN (NITROSTAT) 0.4 MG SL tablet Place 1 tablet (0.4 mg total) under the tongue every 5 (five) minutes as needed for chest pain.  25 tablet  12  . pravastatin (PRAVACHOL) 40 MG tablet Take 1 tablet (40 mg total) by  mouth daily.  90 tablet  1  . promethazine (PHENERGAN) 25 MG tablet Take 25 mg by mouth every 6 (six) hours as needed.        Marland Kitchen DISCONTD: clopidogrel (PLAVIX) 75 MG tablet Take 1 tablet by mouth Daily.        Marland Kitchen DISCONTD: pravastatin (PRAVACHOL) 40 MG tablet Take 1 tablet by mouth Daily.        Allergies  Allergen Reactions  . Floxin (Ofloxacin)     History   Social History  . Marital Status: Widowed    Spouse Name: N/A    Number of Children: N/A  . Years of Education: N/A   Occupational History  . retired    Social History Main Topics  . Smoking status: Former Smoker    Quit date: 09/30/1975  . Smokeless tobacco: Not on file   Comment: quit in 1977 smoked 1.5 -2 ppd  . Alcohol Use: No  . Drug Use: No  . Sexually Active: Yes   Other Topics Concern  . Not on file   Social History Narrative   Lives in [place] with son.  Widowed.Pt is retired from a [employer].    Family History  Problem Relation Age of Onset  . Hypertension    . Cancer    .  Prostate cancer    . Kidney disease    . Colon polyps     Physical Exam: Filed Vitals:   02/14/11 0956  BP: 139/74  Pulse: 55  Height: 6\' 1"  (1.854 m)  Weight: 252 lb (114.306 kg)    GEN- The patient is well appearing, alert and oriented x 3 today.   Head- normocephalic, atraumatic Eyes-  Sclera clear, conjunctiva pink Ears- hearing intact Oropharynx- clear Neck- supple, no JVP Lymph- no cervical lymphadenopathy Lungs- Clear to ausculation bilaterally, normal work of breathing Heart- Regular rate and rhythm, no murmurs, rubs or gallops, PMI not laterally displaced GI- soft, NT, ND, + BS Extremities- no clubbing, cyanosis, or edema MS- no significant deformity or atrophy Skin- no rash or lesion Psych- euthymic mood, full affect Neuro- strength and sensation are intact  ekg today reveals sinus rhythm 55 bpm, septal infarct, diffuse J point elevation, otherwise normal ekg, no changes from prior  Assessment  and Plan:

## 2011-02-14 NOTE — Assessment & Plan Note (Signed)
BP above goal, though he has not taken his home antihypertensive medicines yet today No changes He will follow his BP at home and contact me if elevated

## 2011-02-14 NOTE — Assessment & Plan Note (Signed)
Doing well s/p PCI of the Ramus 8/12 Continue aggressive medical therapy Regular exercise again encouraged today

## 2011-02-28 ENCOUNTER — Telehealth: Payer: Self-pay | Admitting: Internal Medicine

## 2011-02-28 NOTE — Telephone Encounter (Signed)
New problem:  Test results.  

## 2011-02-28 NOTE — Telephone Encounter (Signed)
Informed patient of results/KLanier,RN  

## 2011-05-02 ENCOUNTER — Other Ambulatory Visit: Payer: Self-pay

## 2011-05-02 ENCOUNTER — Telehealth: Payer: Self-pay | Admitting: Internal Medicine

## 2011-05-02 DIAGNOSIS — I10 Essential (primary) hypertension: Secondary | ICD-10-CM

## 2011-05-02 MED ORDER — NEBIVOLOL HCL 10 MG PO TABS: 10.0000 mg | ORAL_TABLET | Freq: Every day | ORAL | Status: DC

## 2011-05-02 NOTE — Telephone Encounter (Signed)
He says that he was taking ECASA 81mg .  He stopped it for 2 days and it got better(stomach quit hurting) It could be the difference in brand.  Denies black stools but his stomach has been torn up for a couple of months.  He goes from having diarrhea to constipation.  He reports that he had a colonoscopy 6 years ago and was due back to have that in 5 years.  May need to have an endoscopy also.  Dr Russella Dar did his colonoscopy  He is going to call there office to try and get in to be seen

## 2011-05-02 NOTE — Telephone Encounter (Signed)
Pt wants to know if he can stop taking asa, hurting his stomach, and [facility] will request bystolic refill pt requesting  Generic?

## 2011-05-02 NOTE — Telephone Encounter (Signed)
Sent in rx and tried to call patient back phone just stopped ringing.  I called back and it just rang and rang

## 2011-05-03 ENCOUNTER — Encounter: Payer: Self-pay | Admitting: Gastroenterology

## 2011-05-05 ENCOUNTER — Ambulatory Visit (INDEPENDENT_AMBULATORY_CARE_PROVIDER_SITE_OTHER): Payer: Medicare Other | Admitting: Internal Medicine

## 2011-05-05 VITALS — BP 130/80 | HR 72 | Temp 98.2°F | Resp 16 | Ht 73.0 in | Wt 250.0 lb

## 2011-05-05 DIAGNOSIS — R1310 Dysphagia, unspecified: Secondary | ICD-10-CM

## 2011-05-05 DIAGNOSIS — M7072 Other bursitis of hip, left hip: Secondary | ICD-10-CM

## 2011-05-05 DIAGNOSIS — M76899 Other specified enthesopathies of unspecified lower limb, excluding foot: Secondary | ICD-10-CM

## 2011-05-05 DIAGNOSIS — I10 Essential (primary) hypertension: Secondary | ICD-10-CM

## 2011-05-05 MED ORDER — CLOPIDOGREL BISULFATE 75 MG PO TABS: 75.0000 mg | ORAL_TABLET | Freq: Every day | ORAL | Status: DC

## 2011-05-05 MED ORDER — LOSARTAN POTASSIUM 100 MG PO TABS: 100.0000 mg | ORAL_TABLET | Freq: Every day | ORAL | Status: DC

## 2011-05-05 MED ORDER — PRAVASTATIN SODIUM 40 MG PO TABS: 40.0000 mg | ORAL_TABLET | Freq: Every day | ORAL | Status: DC

## 2011-05-05 NOTE — Progress Notes (Signed)
Subjective:    Patient ID: Wesley Johnson, male    DOB: November 25, 1940, 71 y.o.   MRN: [id number]  HPI Severe hip pain Mild dysphagia with hx of GERD and symptoms of stricture disease Has BPH with obstuction and has been considering a laser turp Has a colon scheduled The patient rates the pain as 7/10 states it's worse when he rolls on his hip has a history of bursitis of his hips and arthritis of multiple joints.  The pain does not radiate down the leg   Review of Systems  Constitutional: Negative for fever and fatigue.  HENT: Negative for hearing loss, congestion, neck pain and postnasal drip.   Eyes: Negative for discharge, redness and visual disturbance.  Respiratory: Negative for cough, shortness of breath and wheezing.   Cardiovascular: Negative for leg swelling.  Gastrointestinal: Negative for abdominal pain, constipation and abdominal distention.  Genitourinary: Negative for urgency and frequency.  Musculoskeletal: Negative for joint swelling and arthralgias.  Skin: Negative for color change and rash.  Neurological: Negative for weakness and light-headedness.  Hematological: Negative for adenopathy.  Psychiatric/Behavioral: Negative for behavioral problems.   / Past Medical History  Diagnosis Date  . Hyperlipidemia   . Hypertension   . BPH (benign prostatic hyperplasia)   . DJD (degenerative joint disease)   . Lumbar stenosis   . Tubular adenoma of colon 07/2005  . Diverticulosis   . Hemorrhoids   . CAD (coronary artery disease) 10/04/10    s/p PCI of the Ramus, nonobstructive disease of LAD, LCx, and RCA  . IBS (irritable bowel syndrome)   . Insomnia     History   Social History  . Marital Status: Widowed    Spouse Name: N/A    Number of Children: 2  . Years of Education: N/A   Occupational History  . retired    Social History Main Topics  . Smoking status: Former Smoker    Quit date: 09/30/1975  . Smokeless tobacco: Never Used   Comment: quit in 1977  smoked 1.5 -2 ppd  . Alcohol Use: No  . Drug Use: No  . Sexually Active: Yes   Other Topics Concern  . Not on file   Social History Narrative   Lives in [place] with son.  Widowed.Pt is retired from a [employer].    Past Surgical History  Procedure Date  . Angioplasty 10/04/10    PCI Ramus with Promus DES    Family History  Problem Relation Age of Onset  . Hypertension    . Cancer    . Prostate cancer    . Kidney disease    . Colon polyps    . Colon cancer Paternal Grandfather     Allergies  Allergen Reactions  . Floxin (Ofloxacin)     Current Outpatient Prescriptions on File Prior to Visit  Medication Sig Dispense Refill  . alclomethasone (ACLOVATE) 0.05 % cream as needed.      Marland Kitchen aspirin 81 MG chewable tablet Chew 81 mg by mouth daily.        . baclofen (LIORESAL) 10 MG tablet Take 10 mg by mouth at bedtime as needed.        . clopidogrel (PLAVIX) 75 MG tablet Take 1 tablet (75 mg total) by mouth daily.  90 tablet  3  . finasteride (PROSCAR) 5 MG tablet TAKE ONE TABLET BY MOUTH EVERY DAY (REPALCED THE AVODART)  90 tablet  3  . nebivolol (BYSTOLIC) 10 MG tablet Take 1 tablet (10 mg  total) by mouth daily.  30 tablet  11  . nitroGLYCERIN (NITROSTAT) 0.4 MG SL tablet Place 1 tablet (0.4 mg total) under the tongue every 5 (five) minutes as needed for chest pain.  25 tablet  12  . pravastatin (PRAVACHOL) 40 MG tablet Take 1 tablet (40 mg total) by mouth daily.  90 tablet  3  . promethazine (PHENERGAN) 25 MG tablet Take 25 mg by mouth every 6 (six) hours as needed.          BP 130/80  Pulse 72  Temp 98.2 F (36.8 C)  Resp 16  Ht 6\' 1"  (1.854 m)  Wt 250 lb (113.399 kg)  BMI 32.98 kg/m2       Objective:   Physical Exam  Nursing note and vitals reviewed. Constitutional: He is oriented to person, place, and time. He appears well-developed and well-nourished.  HENT:  Head: Normocephalic and atraumatic.  Eyes: Conjunctivae are normal. Pupils are equal, round,  and reactive to light.  Neck: Normal range of motion. Neck supple.  Cardiovascular: Normal rate and regular rhythm.   Pulmonary/Chest: Effort normal and breath sounds normal.  Abdominal: Soft. Bowel sounds are normal.  Musculoskeletal: He exhibits tenderness.       Bursitis of the hip with pain elicited by pressure on the greater trochanter  Neurological: He is alert and oriented to person, place, and time.          Assessment & Plan:  Patient's primary complaint today is his dysphagia and GERD.  He is on Protonix 40 mg by mouth daily but still has symptoms of possible stricture development.  He has an appointment with gastroenterology for colonoscopy I have asked him to mention this to the gastroenterologist to consider an EGD with dilation at that time.  Patient also has hypertension that is stable his current medications.  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  His bursitis of his hip is problematic he is currently on Plavix 75 mg by mouth daily and I believe that the bursitis of the hip may represent low back pain rather than true bursitis of the hip. I've given him stretching exercises if this does not help we may refer him to a pain specialist for consideration for a back injection

## 2011-05-05 NOTE — Patient Instructions (Signed)
Be sure to talk to Dr. Russella Dar about the sense of difficulty with swallowing. That possibly indicates that you have a narrowing or stricture in your esophagus that may need to be dilated. Often they can do this procedure at the same time that they haveyou under anesthesia for your colonoscopy.  I gave you a steroid shot in your hip

## 2011-05-16 ENCOUNTER — Telehealth: Payer: Self-pay | Admitting: Gastroenterology

## 2011-05-16 NOTE — Telephone Encounter (Signed)
Patient would like to discuss his dysphagia.  He c/o difficulty with solids.  He is asked to avoid meat breads and chew well until his appt next week.

## 2011-05-24 ENCOUNTER — Encounter: Payer: Self-pay | Admitting: Gastroenterology

## 2011-05-24 ENCOUNTER — Telehealth: Payer: Self-pay

## 2011-05-24 ENCOUNTER — Ambulatory Visit (INDEPENDENT_AMBULATORY_CARE_PROVIDER_SITE_OTHER): Payer: Medicare Other | Admitting: Gastroenterology

## 2011-05-24 VITALS — BP 136/70 | HR 72 | Ht 73.0 in | Wt 245.8 lb

## 2011-05-24 DIAGNOSIS — R1319 Other dysphagia: Secondary | ICD-10-CM

## 2011-05-24 DIAGNOSIS — K5900 Constipation, unspecified: Secondary | ICD-10-CM

## 2011-05-24 DIAGNOSIS — Z86010 Personal history of colonic polyps: Secondary | ICD-10-CM

## 2011-05-24 DIAGNOSIS — I2510 Atherosclerotic heart disease of native coronary artery without angina pectoris: Secondary | ICD-10-CM

## 2011-05-24 MED ORDER — PANTOPRAZOLE SODIUM 40 MG PO TBEC: 40.0000 mg | DELAYED_RELEASE_TABLET | Freq: Every day | ORAL | Status: DC

## 2011-05-24 MED ORDER — POLYETHYLENE GLYCOL 3350 17 GM/SCOOP PO POWD: 17.0000 g | Freq: Every day | ORAL | Status: DC

## 2011-05-24 NOTE — Telephone Encounter (Signed)
  05/24/2011    RE: Wesley Johnson DOB: January 01, 1941 MRN: [id number]   Dear Dr. Johney Frame,    We have scheduled the above patient for an endoscopic procedure. Our records show that he is on anticoagulation therapy.   Please advise as to how long the patient may come off his therapy of Plavix prior to the procedure, which has not been scheduled yet. Patient had a stent placed over 6 months ago.  Please fax back/ or route the completed form to [place] at [phone].   Sincerely,  Christie Nottingham, CMA  Please respond as soon as possible so we can schedule. Please route back to Christie Nottingham, CMA

## 2011-05-24 NOTE — Patient Instructions (Addendum)
You have been scheduled for a Barium Esophogram at [facility] (1st floor of the hospital) on 05/27/11 at 10:30am. Please arrive 15 minutes prior to your appointment for registration. Make certain not to have anything to eat or drink 6 hours prior to your test. If you need to reschedule for any reason, please contact radiology at [phone] to do so. We have sent a letter to Dr. Hillis Range to request for you to come off your Plavix prior to your Upper Endoscopy/ Colonoscopy we have not yet scheduled for you. We will contact after Dr. Johney Frame responds to [place] regarding this matter.  We have sent the following medications to your pharmacy for you to pick up at your convenience: Pantoprazole. Start taking your Miralax 17 grams in 8 oz of water daily which is over the counter. cc: Darryll Capers, MD       Hillis Range, MD

## 2011-05-24 NOTE — Progress Notes (Signed)
History of Present Illness: This is a 71 year old male here today with his son. He relates a change in bowel habits for the past several months with mild constipation alternating with diarrhea. He was treated with amoxicillin for several months for a GU problem. Recently amoxicillin was discontinued however his bowel habits remain the same. He also notes solid food dysphagia for the past 2-3 months. He has noted mild weight loss, possibly 10 pounds. He underwent placement of a drug-eluting cardiac stent in August 2012 and has been maintained on Plavix and aspirin. He has a history of adenomatous colon polyps removed in June 2007. Denies abdominal pain, change in stool caliber, melena, hematochezia, nausea, vomiting, reflux symptoms, chest pain.  Allergies  Allergen Reactions  . Floxin (Ofloxacin)    Outpatient Prescriptions Prior to Visit  Medication Sig Dispense Refill  . alclomethasone (ACLOVATE) 0.05 % cream as needed.      Marland Kitchen aspirin 81 MG chewable tablet Chew 81 mg by mouth daily.        . baclofen (LIORESAL) 10 MG tablet Take 10 mg by mouth at bedtime as needed.        . clopidogrel (PLAVIX) 75 MG tablet Take 1 tablet (75 mg total) by mouth daily.  90 tablet  3  . finasteride (PROSCAR) 5 MG tablet TAKE ONE TABLET BY MOUTH EVERY DAY (REPALCED THE AVODART)  90 tablet  3  . losartan (COZAAR) 100 MG tablet Take 1 tablet (100 mg total) by mouth daily.  90 tablet  3  . nebivolol (BYSTOLIC) 10 MG tablet Take 1 tablet (10 mg total) by mouth daily.  30 tablet  11  . nitroGLYCERIN (NITROSTAT) 0.4 MG SL tablet Place 1 tablet (0.4 mg total) under the tongue every 5 (five) minutes as needed for chest pain.  25 tablet  12  . pravastatin (PRAVACHOL) 40 MG tablet Take 1 tablet (40 mg total) by mouth daily.  90 tablet  3  . promethazine (PHENERGAN) 25 MG tablet Take 25 mg by mouth every 6 (six) hours as needed.         Past Medical History  Diagnosis Date  . Hyperlipidemia   . Hypertension   . BPH (benign  prostatic hyperplasia)   . DJD (degenerative joint disease)   . Lumbar stenosis   . Tubular adenoma of colon 07/2005  . Diverticulosis   . Hemorrhoids   . CAD (coronary artery disease) 10/04/10    s/p PCI of the Ramus, nonobstructive disease of LAD, LCx, and RCA  . IBS (irritable bowel syndrome)   . Insomnia    Past Surgical History  Procedure Date  . Angioplasty 10/04/10    PCI Ramus with Promus DES   History   Social History  . Marital Status: Widowed    Spouse Name: N/A    Number of Children: 2  . Years of Education: N/A   Occupational History  . retired    Social History Main Topics  . Smoking status: Former Smoker    Quit date: 09/30/1975  . Smokeless tobacco: Never Used   Comment: quit in 1977 smoked 1.5 -2 ppd  . Alcohol Use: No  . Drug Use: No  . Sexually Active: Yes   Other Topics Concern  . None   Social History Narrative   Lives in [place] with son.  Widowed.Pt is retired from a [employer].   Family History  Problem Relation Age of Onset  . Hypertension    . Cancer    .  Prostate cancer    . Kidney disease    . Colon polyps    . Colon cancer Paternal Grandfather     Review of Systems: Pertinent positive and negative review of systems were noted in the above HPI section. All other review of systems were otherwise negative.  Physical Exam: General: Well developed , well nourished, overweight, no acute distress Head: Normocephalic and atraumatic Eyes:  sclerae anicteric, EOMI Ears: Normal auditory acuity Mouth: No deformity or lesions Neck: Supple, no masses or thyromegaly Lungs: Clear throughout to auscultation Heart: Regular rate and rhythm; no murmurs, rubs or bruits Abdomen: Soft, non tender and non distended. No masses, hepatosplenomegaly or hernias noted. Normal Bowel sounds Musculoskeletal: Symmetrical with no gross deformities  Skin: No lesions on visible extremities Pulses:  Normal pulses noted Extremities: No clubbing, cyanosis,  edema or deformities noted Neurological: Alert oriented x 4, grossly nonfocal Cervical Nodes:  No significant cervical adenopathy Inguinal Nodes: No significant inguinal adenopathy Psychological:  Alert and cooperative. Normal mood and affect  Assessment and Recommendations:  1. Dysphagia. Primarily to solid foods. Rule out GERD, esophageal stricture, esophageal neoplasm, motility disorder. Schedule a barium esophagram. Will ask Dr. Johney Frame whether he can come off Plavix for 5 days to allow for a safer upper endoscopy with dilation if needed.  Esophageal dilation on Plavix and aspirin is a high-risk procedure for bleeding. Begin pantoprazole 40 mg daily.  2. History of adenomatous colon polyps. He is overdue for surveillance colonoscopy. Dr. Johney Frame to ascertain risk of a 5 day hold Plavix to allow for a safer colonoscopy and polypectomy. Return office visit in 2-3 weeks for further plans.  3. Change in bowel habits with alternating diarrhea and constipation. Colonoscopy as above for further evaluation. MiraLax once or twice daily on a regular scheduled basis to help prevent constipation. Return office visit in 2-3 weeks for further plans.  4. Coronary artery disease status post drug-eluting stent placement August 2012. Dr. Johney Frame to ascertain whether we can hold Plavix for 5 days since has been over 6 months since his stent placement. Is a

## 2011-05-25 ENCOUNTER — Encounter: Payer: Self-pay | Admitting: Internal Medicine

## 2011-05-25 NOTE — Telephone Encounter (Signed)
As his stent was placed <12 months ago, he should remain on plavix. Please consider performing the procedure on plavix if possible.  Fayrene Fearing Micaela Stith,MD

## 2011-05-26 NOTE — Telephone Encounter (Signed)
Will discuss options with pt at [facility] and after BA esophagram completed

## 2011-05-27 ENCOUNTER — Ambulatory Visit (HOSPITAL_COMMUNITY)
Admission: RE | Admit: 2011-05-27 | Discharge: 2011-05-27 | Disposition: A | Discharge status: Home / Self Care | Payer: Medicare Other | Source: Ambulatory Visit | Attending: Gastroenterology | Admitting: Gastroenterology

## 2011-05-27 DIAGNOSIS — R1319 Other dysphagia: Secondary | ICD-10-CM

## 2011-05-27 DIAGNOSIS — R1310 Dysphagia, unspecified: Secondary | ICD-10-CM | POA: Insufficient documentation

## 2011-05-27 DIAGNOSIS — K224 Dyskinesia of esophagus: Secondary | ICD-10-CM | POA: Insufficient documentation

## 2011-06-13 ENCOUNTER — Ambulatory Visit (INDEPENDENT_AMBULATORY_CARE_PROVIDER_SITE_OTHER): Payer: Medicare Other | Admitting: Gastroenterology

## 2011-06-13 ENCOUNTER — Ambulatory Visit: Payer: Medicare Other | Admitting: Gastroenterology

## 2011-06-13 ENCOUNTER — Encounter: Payer: Self-pay | Admitting: Gastroenterology

## 2011-06-13 VITALS — BP 132/60 | HR 74 | Ht 73.0 in | Wt 247.0 lb

## 2011-06-13 DIAGNOSIS — R110 Nausea: Secondary | ICD-10-CM

## 2011-06-13 DIAGNOSIS — Z86010 Personal history of colonic polyps: Secondary | ICD-10-CM

## 2011-06-13 DIAGNOSIS — R1319 Other dysphagia: Secondary | ICD-10-CM

## 2011-06-13 MED ORDER — PROMETHAZINE HCL 25 MG PO TABS: 25.0000 mg | ORAL_TABLET | Freq: Four times a day (QID) | ORAL | Status: DC | PRN

## 2011-06-13 NOTE — Patient Instructions (Addendum)
You will be due for a recall colonoscopy in 10/2011. We will send you a reminder in the mail when it gets closer to that time. Stop taking pantoprazole and adjust your diet as tolerated.

## 2011-06-13 NOTE — Progress Notes (Signed)
History of Present Illness: This is a 71 year old male who returns today with his son for followup of dysphagia. Barium esophagram showed esophageal dysmotility with no strictures. He states he continues to have difficulty with large pills and solid foods. He was placed on pantoprazole he states shortly after that he developed nausea and dyspepsia. Dr. Johney Frame felt it was not safe to stop his Plavix until at least a full year of therapy which ends in August. Patient states he has ear congestion and throat congestion and he is concerned this is contributing to his swallowing problems  Current Medications, Allergies, Past Medical History, Past Surgical History, Family History and Social History were reviewed in [employer] record.  Physical Exam: General: Well developed , well nourished, no acute distress Head: Normocephalic and atraumatic Eyes:  sclerae anicteric, EOMI Ears: Normal auditory acuity Mouth: No deformity or lesions Lungs: Clear throughout to auscultation Heart: Regular rate and rhythm; no murmurs, rubs or bruits Abdomen: Soft, non tender and non distended. No masses, hepatosplenomegaly or hernias noted. Normal Bowel sounds Musculoskeletal: Symmetrical with no gross deformities  Pulses:  Normal pulses noted Extremities: No clubbing, cyanosis, edema or deformities noted Neurological: Alert oriented x 4, grossly nonfocal Psychological:  Alert and cooperative. Normal mood and affect  Assessment and Recommendations:  1. Nausea possibly secondary to pantoprazole. Will DC. Patient is advised to call if his nausea and dyspepsia do not resolve.  2. 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 Patient is advised to modify his diet to avoid solid foods and other foods that create difficulty swallowing. No plans for upper endoscopy at this time. Advised him to followup with his ENT for ear and throat congestion which could be contributing to his  dysphagia.   3. History of adenomatous colon polyps. He is due for surveillance colonoscopy however we will need to wait until he can safely hold Plavix in September 2013.  4. CAD, S/P stent 09/2010 on Plavix.

## 2011-06-21 ENCOUNTER — Telehealth: Payer: Self-pay | Admitting: Internal Medicine

## 2011-06-21 NOTE — Telephone Encounter (Signed)
Patient returning nurse call, he can be reached at [phone] as patient got disconnected by mistake.

## 2011-06-22 ENCOUNTER — Ambulatory Visit (INDEPENDENT_AMBULATORY_CARE_PROVIDER_SITE_OTHER): Payer: Medicare Other | Admitting: Internal Medicine

## 2011-06-22 ENCOUNTER — Encounter: Payer: Self-pay | Admitting: Internal Medicine

## 2011-06-22 VITALS — BP 138/86 | HR 66 | Resp 18 | Ht 73.0 in | Wt 244.0 lb

## 2011-06-22 DIAGNOSIS — I2510 Atherosclerotic heart disease of native coronary artery without angina pectoris: Secondary | ICD-10-CM

## 2011-06-22 DIAGNOSIS — M542 Cervicalgia: Secondary | ICD-10-CM

## 2011-06-22 DIAGNOSIS — I10 Essential (primary) hypertension: Secondary | ICD-10-CM

## 2011-06-22 NOTE — Patient Instructions (Signed)
Your physician wants you to follow-up in: 6 months with Dr. Allred. You will receive a reminder letter in the mail two months in advance. If you don't receive a letter, please call our office to schedule the follow-up appointment.  

## 2011-06-23 ENCOUNTER — Encounter: Payer: Self-pay | Admitting: Internal Medicine

## 2011-06-23 NOTE — Assessment & Plan Note (Signed)
As above  Stable No change required today

## 2011-06-23 NOTE — Assessment & Plan Note (Signed)
Stable No change required today  

## 2011-06-23 NOTE — Progress Notes (Signed)
PCP: Carrie Mew, MD, MD   The patient presents today for cardiology followup.  He reports having heaviness within his left arm as well as mild pain over his left shoulder/ neck for 2-3 days.  This began shortly after changing his car tire.  He reports having to "strain pretty hard" to loosen lug nuts.  His pain is gradually improving.  He denies chest pain, jaw pain, N/V, SOB, or any other concerns.  This arm discomfort is unlike his prior ischemic symptoms.   He continues walking 1-2.5 mile most days without difficult or any exertional symptoms.    Past Medical History  Diagnosis Date  . Hyperlipidemia   . Hypertension   . BPH (benign prostatic hyperplasia)   . DJD (degenerative joint disease)   . Lumbar stenosis   . Tubular adenoma of colon 07/2005  . Diverticulosis   . Hemorrhoids   . CAD (coronary artery disease) 10/04/10    s/p PCI of the Ramus, nonobstructive disease of LAD, LCx, and RCA  . IBS (irritable bowel syndrome)   . Insomnia    Past Surgical History  Procedure Date  . Coronary angioplasty with stent placement 10/04/10    PCI Ramus with Promus DES    Current Outpatient Prescriptions  Medication Sig Dispense Refill  . alclomethasone (ACLOVATE) 0.05 % cream as needed.      Marland Kitchen aspirin 81 MG chewable tablet Chew 81 mg by mouth daily.        . clopidogrel (PLAVIX) 75 MG tablet Take 1 tablet (75 mg total) by mouth daily.  90 tablet  3  . finasteride (PROSCAR) 5 MG tablet TAKE ONE TABLET BY MOUTH EVERY DAY (REPALCED THE AVODART)  90 tablet  3  . losartan (COZAAR) 100 MG tablet Take 1 tablet (100 mg total) by mouth daily.  90 tablet  3  . nebivolol (BYSTOLIC) 10 MG tablet Take 1 tablet (10 mg total) by mouth daily.  30 tablet  11  . nitroGLYCERIN (NITROSTAT) 0.4 MG SL tablet Place 1 tablet (0.4 mg total) under the tongue every 5 (five) minutes as needed for chest pain.  25 tablet  12  . polyethylene glycol powder (GLYCOLAX/MIRALAX) powder Take 17 g by mouth daily.  255 g   0  . pravastatin (PRAVACHOL) 40 MG tablet Take 1 tablet (40 mg total) by mouth daily.  90 tablet  3  . promethazine (PHENERGAN) 25 MG tablet Take 1 tablet (25 mg total) by mouth every 6 (six) hours as needed.  30 tablet  0  . baclofen (LIORESAL) 10 MG tablet Take 10 mg by mouth at bedtime as needed.          Allergies  Allergen Reactions  . Floxin (Ofloxacin)     N&v.    History   Social History  . Marital Status: Widowed    Spouse Name: N/A    Number of Children: 2  . Years of Education: N/A   Occupational History  . Retired     Social History Main Topics  . Smoking status: Former Smoker    Quit date: 09/30/1975  . Smokeless tobacco: Never Used   Comment: quit in 1977 smoked 1.5 -2 ppd  . Alcohol Use: No  . Drug Use: No  . Sexually Active: Yes   Other Topics Concern  . Not on file   Social History Narrative   Lives in [place] with son.  Widowed.Pt is retired from a [employer].    Family History  Problem Relation  Age of Onset  . Hypertension    . Colon polyps    . Colon cancer Paternal Grandfather    Physical Exam: Filed Vitals:   06/22/11 1533  BP: 138/86  Pulse: 66  Resp: 18  Height: 6\' 1"  (1.854 m)  Weight: 244 lb (110.678 kg)    GEN- The patient is well appearing, alert and oriented x 3 today.   Head- normocephalic, atraumatic Eyes-  Sclera clear, conjunctiva pink Ears- hearing intact Oropharynx- clear Neck- supple, no JVP Lymph- no cervical lymphadenopathy Lungs- Clear to ausculation bilaterally, normal work of breathing Heart- Regular rate and rhythm, no murmurs, rubs or gallops, PMI not laterally displaced GI- soft, NT, ND, + BS Extremities- no clubbing, cyanosis, or edema MS- no significant deformity or atrophy, moderate ttp over left trapezius and left deltoid regions,  Full ROM Skin- no rash or lesion Psych- euthymic mood, full affect Neuro- CNII -XII intact, strength5/5 throughout and sensation are intact, FNF intact, no focal neuro  deficits  ekg today reveals sinus rhythm 65 bpm, septal infarction, otherwise normal ekg  Assessment and Plan:

## 2011-06-23 NOTE — Assessment & Plan Note (Signed)
The patient presents today for evaluation of left neck and arm discomfort.  He has no focal neuro deficits.  Exam is consistent with musculoskeletal pain which began shortly after changing a car tire.  He has no exertional symptoms or ekg changes and this discomfort is unlike his prior angina.  I would recommend tylenol prn for pain.  No further CV workup planned. If not improved, he should follow-up with his PCP.  If he developed chest pain, SOB, neurologic deficits, and any new exertional symptoms he should contact my office immediatly or go to the ER

## 2011-08-05 ENCOUNTER — Encounter: Payer: Self-pay | Admitting: Internal Medicine

## 2011-08-05 ENCOUNTER — Ambulatory Visit (INDEPENDENT_AMBULATORY_CARE_PROVIDER_SITE_OTHER): Payer: Medicare Other | Admitting: Internal Medicine

## 2011-08-05 VITALS — BP 124/76 | HR 76 | Temp 98.2°F | Resp 16 | Ht 73.0 in | Wt 249.0 lb

## 2011-08-05 DIAGNOSIS — N401 Enlarged prostate with lower urinary tract symptoms: Secondary | ICD-10-CM

## 2011-08-05 DIAGNOSIS — I10 Essential (primary) hypertension: Secondary | ICD-10-CM

## 2011-08-05 DIAGNOSIS — N138 Other obstructive and reflux uropathy: Secondary | ICD-10-CM

## 2011-08-05 DIAGNOSIS — I2510 Atherosclerotic heart disease of native coronary artery without angina pectoris: Secondary | ICD-10-CM

## 2011-08-05 MED ORDER — ALCLOMETASONE DIPROPIONATE 0.05 % EX CREA: TOPICAL_CREAM | Freq: Two times a day (BID) | CUTANEOUS | Status: DC

## 2011-08-05 MED ORDER — PRAVASTATIN SODIUM 40 MG PO TABS: 40.0000 mg | ORAL_TABLET | Freq: Every day | ORAL | Status: DC

## 2011-08-05 NOTE — Patient Instructions (Signed)
A separate water before you eat Make sure that she cut everything into small pieces And tuck chin when you have problems while

## 2011-08-05 NOTE — Progress Notes (Signed)
Subjective:    Patient ID: Wesley Johnson, male    DOB: [birth year], 71 y.o.   MRN: [id number]  HPI    Review of Systems  Constitutional: Negative for fever and fatigue.  HENT: Negative for hearing loss, congestion, neck pain and postnasal drip.   Eyes: Negative for discharge, redness and visual disturbance.  Respiratory: Negative for cough, shortness of breath and wheezing.   Cardiovascular: Negative for leg swelling.  Gastrointestinal: Positive for abdominal pain and abdominal distention. Negative for constipation.  Genitourinary: Negative for urgency and frequency.  Musculoskeletal: Negative for joint swelling and arthralgias.  Skin: Negative for color change and rash.  Neurological: Negative for weakness and light-headedness.  Hematological: Negative for adenopathy.  Psychiatric/Behavioral: Negative for behavioral problems.   The patient is instructed to continue all medications as prescribed. Schedule followup with check out clerk upon leaving the clinic Past Medical History  Diagnosis Date  . Hyperlipidemia   . Hypertension   . BPH (benign prostatic hyperplasia)   . DJD (degenerative joint disease)   . Lumbar stenosis   . Tubular adenoma of colon 07/2005  . Diverticulosis   . Hemorrhoids   . CAD (coronary artery disease) 10/04/10    s/p PCI of the Ramus, nonobstructive disease of LAD, LCx, and RCA  . IBS (irritable bowel syndrome)   . Insomnia     History   Social History  . Marital Status: Widowed    Spouse Name: N/A    Number of Children: 2  . Years of Education: N/A   Occupational History  . Retired     Social History Main Topics  . Smoking status: Former Smoker    Quit date: 09/30/1975  . Smokeless tobacco: Never Used   Comment: quit in 1977 smoked 1.5 -2 ppd  . Alcohol Use: No  . Drug Use: No  . Sexually Active: Yes   Other Topics Concern  . Not on file   Social History Narrative   Lives in [place] with son.  Widowed.Pt is retired from a [employer].    Past Surgical History  Procedure Date  . Coronary angioplasty with stent placement 10/04/10    PCI Ramus with Promus DES    Family History  Problem Relation Age of Onset  . Hypertension    . Colon polyps    . Colon cancer Paternal Grandfather     Allergies  Allergen Reactions  . Floxin (Ofloxacin)     N&v.    Current Outpatient Prescriptions on File Prior to Visit  Medication Sig Dispense Refill  . aspirin 81 MG chewable tablet Chew 81 mg by mouth daily.        . baclofen (LIORESAL) 10 MG tablet Take 10 mg by mouth at bedtime as needed.        . clopidogrel (PLAVIX) 75 MG tablet Take 1 tablet (75 mg total) by mouth daily.  90 tablet  3  . finasteride (PROSCAR) 5 MG tablet TAKE ONE TABLET BY MOUTH EVERY DAY (REPALCED THE AVODART)  90 tablet  3  . losartan (COZAAR) 100 MG tablet Take 1 tablet (100 mg total) by mouth daily.  90 tablet  3  . nebivolol (BYSTOLIC) 10 MG tablet Take 1 tablet (10 mg total) by mouth daily.  30 tablet  11  . nitroGLYCERIN (NITROSTAT) 0.4 MG SL tablet Place 1 tablet (0.4 mg total) under the tongue every 5 (five) minutes as needed for chest pain.  25 tablet  12  . polyethylene glycol powder (  GLYCOLAX/MIRALAX) powder Take 17 g by mouth daily.  255 g  0  . promethazine (PHENERGAN) 25 MG tablet Take 1 tablet (25 mg total) by mouth every 6 (six) hours as needed.  30 tablet  0  . DISCONTD: pravastatin (PRAVACHOL) 40 MG tablet Take 1 tablet (40 mg total) by mouth daily.  90 tablet  3    BP 124/76  Pulse 76  Temp 98.2 F (36.8 C)  Resp 16  Ht 6\' 1"  (1.854 m)  Wt 249 lb (112.946 kg)  BMI 32.85 kg/m2        Objective:   Physical Exam  Nursing note and vitals reviewed. Constitutional: He is oriented to person, place, and time. He appears well-developed and well-nourished.  HENT:  Head: Normocephalic and atraumatic.  Eyes: Conjunctivae are normal. Pupils are equal, round, and reactive to light.  Neck: Normal range of motion. Neck supple.    Cardiovascular: Normal rate and regular rhythm.   Pulmonary/Chest: Effort normal and breath sounds normal.  Abdominal: Soft. Bowel sounds are normal.  Neurological: He is alert and oriented to person, place, and time.  Skin: Skin is warm and dry.          Assessment & Plan:  Easy bruising form the plavix Moderate persistent oro-pharyngeal dysphagia Stable blood pressure Keep appointment with the opthamologist Urologist... 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

## 2011-08-25 ENCOUNTER — Ambulatory Visit: Payer: Medicare Other | Admitting: Internal Medicine

## 2011-09-30 ENCOUNTER — Other Ambulatory Visit: Payer: Self-pay | Admitting: Internal Medicine

## 2011-11-11 ENCOUNTER — Encounter: Payer: Self-pay | Admitting: Internal Medicine

## 2011-11-11 ENCOUNTER — Ambulatory Visit (INDEPENDENT_AMBULATORY_CARE_PROVIDER_SITE_OTHER): Payer: Medicare Other | Admitting: Internal Medicine

## 2011-11-11 VITALS — BP 144/84 | HR 72 | Temp 98.2°F | Resp 16 | Ht 73.0 in | Wt 252.0 lb

## 2011-11-11 DIAGNOSIS — I10 Essential (primary) hypertension: Secondary | ICD-10-CM

## 2011-11-11 DIAGNOSIS — N400 Enlarged prostate without lower urinary tract symptoms: Secondary | ICD-10-CM

## 2011-11-11 DIAGNOSIS — N411 Chronic prostatitis: Secondary | ICD-10-CM

## 2011-11-11 DIAGNOSIS — Z23 Encounter for immunization: Secondary | ICD-10-CM

## 2011-11-11 DIAGNOSIS — I2510 Atherosclerotic heart disease of native coronary artery without angina pectoris: Secondary | ICD-10-CM

## 2011-11-11 DIAGNOSIS — M169 Osteoarthritis of hip, unspecified: Secondary | ICD-10-CM

## 2011-11-11 DIAGNOSIS — M76899 Other specified enthesopathies of unspecified lower limb, excluding foot: Secondary | ICD-10-CM

## 2011-11-11 DIAGNOSIS — M7072 Other bursitis of hip, left hip: Secondary | ICD-10-CM

## 2011-11-11 MED ORDER — METHYLPREDNISOLONE ACETATE 40 MG/ML IJ SUSP: 40.0000 mg | Freq: Once | INTRAMUSCULAR | Status: DC

## 2011-11-11 NOTE — Progress Notes (Signed)
Subjective:    Patient ID: Wesley Johnson, male    DOB: 30-Dec-1940, 72 y.o.   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  His blood pressure slightly elevated today but he is in arthritic pain we'll recheck his blood pressure to see if it has dropped.  Weight is stable history of benign prostatic hypertrophy history of hypothyroidism history of hypertension.  Patient will get a flu shot today  Review of Systems  Constitutional: Negative for fever and fatigue.  HENT: Negative for hearing loss, congestion, neck pain and postnasal drip.   Eyes: Negative for discharge, redness and visual disturbance.  Respiratory: Negative for cough, shortness of breath and wheezing.   Cardiovascular: Negative for leg swelling.  Gastrointestinal: Negative for abdominal pain, constipation and abdominal distention.  Genitourinary: Negative for urgency and frequency.  Musculoskeletal: Positive for myalgias, back pain and joint swelling. Negative for arthralgias.  Skin: Negative for color change and rash.  Neurological: Positive for weakness. Negative for light-headedness.  Hematological: Negative for adenopathy.  Psychiatric/Behavioral: Negative for behavioral problems.   Past Medical History  Diagnosis Date  . Hyperlipidemia   . Hypertension   . BPH (benign prostatic hyperplasia)   . DJD (degenerative joint disease)   . Lumbar stenosis   . Tubular adenoma of colon 07/2005  . Diverticulosis   . Hemorrhoids   . CAD (coronary artery disease) 10/04/10    s/p PCI of the Ramus, nonobstructive disease of LAD, LCx, and RCA  . IBS (irritable bowel syndrome)   . Insomnia     History   Social History  . Marital Status: Widowed    Spouse Name: N/A    Number of Children: 2  . Years of Education: N/A   Occupational History  . Retired     Social History Main Topics  .  Smoking status: Former Smoker    Quit date: 09/30/1975  . Smokeless tobacco: Never Used   Comment: quit in 1977 smoked 1.5 -2 ppd  . Alcohol Use: No  . Drug Use: No  . Sexually Active: Yes   Other Topics Concern  . Not on file   Social History Narrative   Lives in [place] with son.  Widowed.Pt is retired from a [employer].    Past Surgical History  Procedure Date  . Coronary angioplasty with stent placement 10/04/10    PCI Ramus with Promus DES    Family History  Problem Relation Age of Onset  . Hypertension    . Colon polyps    . Colon cancer Paternal Grandfather     Allergies  Allergen Reactions  . Floxin (Ofloxacin)     N&v.    Current Outpatient Prescriptions on File Prior to Visit  Medication Sig Dispense Refill  . alclomethasone (ACLOVATE) 0.05 % cream Apply topically 2 (two) times daily.  45 g  6  . aspirin 81 MG chewable tablet Chew 81 mg by mouth daily.        . baclofen (LIORESAL) 10 MG tablet Take 10 mg by mouth at bedtime as needed.        . clopidogrel (PLAVIX) 75 MG tablet Take 1 tablet (75 mg total) by mouth daily.  90 tablet  3  . finasteride (PROSCAR) 5 MG tablet TAKE ONE TABLET BY MOUTH EVERY DAY(REPLACED THE AVODART)  90 tablet  0  . losartan (COZAAR) 100 MG  tablet Take 1 tablet (100 mg total) by mouth daily.  90 tablet  3  . nebivolol (BYSTOLIC) 10 MG tablet Take 1 tablet (10 mg total) by mouth daily.  30 tablet  11  . polyethylene glycol powder (GLYCOLAX/MIRALAX) powder Take 17 g by mouth daily.  255 g  0  . pravastatin (PRAVACHOL) 40 MG tablet Take 1 tablet (40 mg total) by mouth daily.  90 tablet  3  . promethazine (PHENERGAN) 25 MG tablet Take 1 tablet (25 mg total) by mouth every 6 (six) hours as needed.  30 tablet  0  . DISCONTD: nitroGLYCERIN (NITROSTAT) 0.4 MG SL tablet Place 1 tablet (0.4 mg total) under the tongue every 5 (five) minutes as needed for chest pain.  25 tablet  12    BP 144/84  Pulse 72  Temp 98.2 F (36.8 C)  Resp 16   Ht 6\' 1"  (1.854 m)  Wt 252 lb (114.306 kg)  BMI 33.25 kg/m2        Objective:   Physical Exam  Nursing note and vitals reviewed. Constitutional: He appears well-developed and well-nourished.  HENT:  Head: Normocephalic and atraumatic.  Eyes: Conjunctivae normal are normal. Pupils are equal, round, and reactive to light.  Neck: Normal range of motion. Neck supple.  Cardiovascular: Normal rate and regular rhythm.   Pulmonary/Chest: Effort normal and breath sounds normal.  Abdominal: Soft. Bowel sounds are normal.          Assessment & Plan:  Increased OA pain in left hip with hx of OA in hip   Informed consent obtained and the patient's left hip was prepped with betadine. Local anesthesia was obtained with topical spray. Then 40 mg of Depo-Medrol and 1/2 cc of lidocaine was injected into the joint space. The patient tolerated the procedure without complications. Post injection care discussed with patient.  Stable HTN Continue the bystollic  Stable prostatism

## 2011-12-19 ENCOUNTER — Encounter: Payer: Self-pay | Admitting: Internal Medicine

## 2011-12-19 ENCOUNTER — Ambulatory Visit (INDEPENDENT_AMBULATORY_CARE_PROVIDER_SITE_OTHER): Payer: Medicare Other | Admitting: Internal Medicine

## 2011-12-19 VITALS — BP 140/68 | HR 57 | Ht 73.0 in | Wt 251.0 lb

## 2011-12-19 DIAGNOSIS — E7800 Pure hypercholesterolemia, unspecified: Secondary | ICD-10-CM

## 2011-12-19 DIAGNOSIS — T383X5A Adverse effect of insulin and oral hypoglycemic [antidiabetic] drugs, initial encounter: Secondary | ICD-10-CM

## 2011-12-19 DIAGNOSIS — E785 Hyperlipidemia, unspecified: Secondary | ICD-10-CM

## 2011-12-19 DIAGNOSIS — I10 Essential (primary) hypertension: Secondary | ICD-10-CM

## 2011-12-19 DIAGNOSIS — R252 Cramp and spasm: Secondary | ICD-10-CM

## 2011-12-19 LAB — LIPID PANEL: Total CHOL/HDL Ratio: 4

## 2011-12-19 LAB — BASIC METABOLIC PANEL
BUN: 18 mg/dL (ref 6–23)
CO2: 28 mEq/L (ref 19–32)
Calcium: 9.2 mg/dL (ref 8.4–10.5)
GFR: 77.82 mL/min (ref >60.00)
Glucose, Bld: 93 mg/dL (ref 70–99)
Potassium: 4.5 mEq/L (ref 3.5–5.1)
Sodium: 136 mEq/L (ref 135–145)

## 2011-12-19 LAB — CARDIAC PANEL
CK-MB: 1.5 ng/mL (ref 0.3–4.0)
Relative Index: 2.5 calc (ref 0.0–2.5)
Total CK: 59 U/L (ref 7–232)

## 2011-12-19 LAB — HEPATIC FUNCTION PANEL
ALT: 22 U/L (ref 0–53)
AST: 20 U/L (ref 0–37)
Bilirubin, Direct: 0.2 mg/dL (ref 0.0–0.3)
Total Bilirubin: 0.8 mg/dL (ref 0.3–1.2)

## 2011-12-19 NOTE — Assessment & Plan Note (Signed)
Check fasting lipids and LFTs today Given cramps will also check CK and electrolytes

## 2011-12-19 NOTE — Assessment & Plan Note (Signed)
Doing well s/p PCI of the ramus 8/12 Stop plavix as he is > 12 months post revascularization with a promus stent (as per guidelines)  Check Lipids today

## 2011-12-19 NOTE — Assessment & Plan Note (Signed)
Repeat by MD is 132/72 Continue current medicine  Return to see Norma Fredrickson in 6 months I will see in a year

## 2011-12-19 NOTE — Patient Instructions (Addendum)
Your physician wants you to follow-up in: 6 months with Sunday Spillers and 12 months with Dr. Jacquiline Doe will receive a reminder letter in the mail two months in advance. If you don't receive a letter, please call our office to schedule the follow-up appointment.   Your physician has recommended you make the following change in your medication:  1) Stop Plavix  Your physician recommends that you return for lab work today

## 2011-12-19 NOTE — Progress Notes (Signed)
PCP: Carrie Mew, MD  Wesley Johnson is a 71 y.o. male who presents today for routine cardiology followup.  Since last being seen in our clinic, the patient reports doing very well.  Unfortunately, he has not been as diligent with his exercise routine as previous.  Today, he denies symptoms of palpitations, chest pain, shortness of breath,  lower extremity edema, dizziness, presyncope, or syncope.  He has occasional leg cramps at night.  He also reports bruising with plavix.  The patient is otherwise without complaint today.   Past Medical History  Diagnosis Date  . Hyperlipidemia   . Hypertension   . BPH (benign prostatic hyperplasia)   . DJD (degenerative joint disease)   . Lumbar stenosis   . Tubular adenoma of colon 07/2005  . Diverticulosis   . Hemorrhoids   . CAD (coronary artery disease) 10/04/10    s/p PCI of the Ramus, nonobstructive disease of LAD, LCx, and RCA  . IBS (irritable bowel syndrome)   . Insomnia    Past Surgical History  Procedure Date  . Coronary angioplasty with stent placement 10/04/10    PCI Ramus with Promus DES    Current Outpatient Prescriptions  Medication Sig Dispense Refill  . alclomethasone (ACLOVATE) 0.05 % cream Apply topically 2 (two) times daily.  45 g  6  . aspirin 81 MG chewable tablet Chew 81 mg by mouth daily.        . baclofen (LIORESAL) 10 MG tablet Take 10 mg by mouth at bedtime as needed.        . clopidogrel (PLAVIX) 75 MG tablet Take 1 tablet (75 mg total) by mouth daily.  90 tablet  3  . finasteride (PROSCAR) 5 MG tablet TAKE ONE TABLET BY MOUTH EVERY DAY(REPLACED THE AVODART)  90 tablet  0  . losartan (COZAAR) 100 MG tablet Take 1 tablet (100 mg total) by mouth daily.  90 tablet  3  . nebivolol (BYSTOLIC) 10 MG tablet Take 1 tablet (10 mg total) by mouth daily.  30 tablet  11  . nitroGLYCERIN (NITROSTAT) 0.4 MG SL tablet Place 0.4 mg under the tongue every 5 (five) minutes as needed.      . polyethylene glycol powder  (GLYCOLAX/MIRALAX) powder Take 17 g by mouth daily.  255 g  0  . pravastatin (PRAVACHOL) 40 MG tablet Take 1 tablet (40 mg total) by mouth daily.  90 tablet  3  . promethazine (PHENERGAN) 25 MG tablet Take 1 tablet (25 mg total) by mouth every 6 (six) hours as needed.  30 tablet  0    Physical Exam: Filed Vitals:   12/19/11 0928  BP: 140/68  Pulse: 57  Height: 6\' 1"  (1.854 m)  Weight: 251 lb (113.853 kg)  SpO2: 97%    GEN- The patient is well appearing, alert and oriented x 3 today.   Head- normocephalic, atraumatic Eyes-  Sclera clear, conjunctiva pink Ears- hearing intact Oropharynx- clear Lungs- Clear to ausculation bilaterally, normal work of breathing Heart- Regular rate and rhythm, no murmurs, rubs or gallops, PMI not laterally displaced GI- soft, NT, ND, + BS Extremities- no clubbing, cyanosis, or edema  ekg today reveals sinus rhythm 57 bpm, septal infarction, otherwise normal ekg  Assessment and Plan:

## 2011-12-27 ENCOUNTER — Telehealth: Payer: Self-pay | Admitting: Internal Medicine

## 2011-12-27 NOTE — Telephone Encounter (Signed)
Spoke with son  He is aware of all labs

## 2011-12-27 NOTE — Telephone Encounter (Signed)
Pt would like to discuss labs again mainly his potassium he has one more question

## 2012-02-03 ENCOUNTER — Other Ambulatory Visit: Payer: Self-pay | Admitting: *Deleted

## 2012-02-03 MED ORDER — FINASTERIDE 5 MG PO TABS: 5.0000 mg | ORAL_TABLET | Freq: Every day | ORAL | Status: DC

## 2012-02-10 ENCOUNTER — Ambulatory Visit (INDEPENDENT_AMBULATORY_CARE_PROVIDER_SITE_OTHER): Payer: Medicare Other | Admitting: Internal Medicine

## 2012-02-10 ENCOUNTER — Encounter: Payer: Self-pay | Admitting: Internal Medicine

## 2012-02-10 VITALS — BP 140/70 | HR 72 | Temp 98.0°F | Resp 16 | Ht 73.0 in | Wt 252.0 lb

## 2012-02-10 DIAGNOSIS — I10 Essential (primary) hypertension: Secondary | ICD-10-CM

## 2012-02-10 DIAGNOSIS — N400 Enlarged prostate without lower urinary tract symptoms: Secondary | ICD-10-CM

## 2012-02-10 DIAGNOSIS — M1990 Unspecified osteoarthritis, unspecified site: Secondary | ICD-10-CM

## 2012-02-10 DIAGNOSIS — N411 Chronic prostatitis: Secondary | ICD-10-CM

## 2012-02-10 MED ORDER — CIPROFLOXACIN HCL 500 MG PO TABS: 500.0000 mg | ORAL_TABLET | Freq: Two times a day (BID) | ORAL | Status: DC

## 2012-02-10 NOTE — Progress Notes (Signed)
Subjective:    Patient ID: Wesley Johnson, male    DOB: 1940/12/03, 71 y.o.   MRN: [id number]  HPI Discussed sleep apnea and he does not want to have a sleep study at this time Stable blood pressure    Review of Systems  Constitutional: Negative for fever and fatigue.  HENT: Negative for hearing loss, congestion, neck pain and postnasal drip.   Eyes: Positive for redness and itching. Negative for discharge and visual disturbance.  Respiratory: Negative for cough, shortness of breath and wheezing.   Cardiovascular: Negative for leg swelling.  Gastrointestinal: Negative for abdominal pain, constipation and abdominal distention.  Genitourinary: Negative for urgency and frequency.  Musculoskeletal: Negative for joint swelling and arthralgias.  Skin: Negative for color change and rash.  Neurological: Negative for weakness and light-headedness.  Hematological: Negative for adenopathy.  Psychiatric/Behavioral: Negative for behavioral problems.   Past Medical History  Diagnosis Date  . Hyperlipidemia   . Hypertension   . BPH (benign prostatic hyperplasia)   . DJD (degenerative joint disease)   . Lumbar stenosis   . Tubular adenoma of colon 07/2005  . Diverticulosis   . Hemorrhoids   . CAD (coronary artery disease) 10/04/10    s/p PCI of the Ramus, nonobstructive disease of LAD, LCx, and RCA  . IBS (irritable bowel syndrome)   . Insomnia     History   Social History  . Marital Status: Widowed    Spouse Name: N/A    Number of Children: 2  . Years of Education: N/A   Occupational History  . Retired     Social History Main Topics  . Smoking status: Former Smoker    Quit date: 09/30/1975  . Smokeless tobacco: Never Used     Comment: quit in 1977 smoked 1.5 -2 ppd  . Alcohol Use: No  . Drug Use: No  . Sexually Active: Yes   Other Topics Concern  . Not on file   Social History Narrative   Lives in [place] with son.  Widowed.Pt is retired from a [employer].     Past Surgical History  Procedure Date  . Coronary angioplasty with stent placement 10/04/10    PCI Ramus with Promus DES    Family History  Problem Relation Age of Onset  . Hypertension    . Colon polyps    . Colon cancer Paternal Grandfather     Allergies  Allergen Reactions  . Floxin (Ofloxacin)     N&v.    Current Outpatient Prescriptions on File Prior to Visit  Medication Sig Dispense Refill  . alclomethasone (ACLOVATE) 0.05 % cream Apply topically 2 (two) times daily.  45 g  6  . aspirin 81 MG chewable tablet Chew 81 mg by mouth daily.        . baclofen (LIORESAL) 10 MG tablet Take 10 mg by mouth at bedtime as needed.        . finasteride (PROSCAR) 5 MG tablet Take 1 tablet (5 mg total) by mouth daily.  90 tablet  3  . losartan (COZAAR) 100 MG tablet Take 1 tablet (100 mg total) by mouth daily.  90 tablet  3  . nebivolol (BYSTOLIC) 10 MG tablet Take 1 tablet (10 mg total) by mouth daily.  30 tablet  11  . nitroGLYCERIN (NITROSTAT) 0.4 MG SL tablet Place 0.4 mg under the tongue every 5 (five) minutes as needed.      . polyethylene glycol powder (GLYCOLAX/MIRALAX) powder Take 17 g by mouth daily.  255 g  0  . pravastatin (PRAVACHOL) 40 MG tablet Take 1 tablet (40 mg total) by mouth daily.  90 tablet  3  . promethazine (PHENERGAN) 25 MG tablet Take 1 tablet (25 mg total) by mouth every 6 (six) hours as needed.  30 tablet  0    BP 140/70  Pulse 72  Temp 98 F (36.7 C)  Resp 16  Ht 6\' 1"  (1.854 m)  Wt 252 lb (114.306 kg)  BMI 33.25 kg/m2       Objective:   Physical Exam  Nursing note and vitals reviewed. Constitutional: He appears well-developed and well-nourished.  HENT:  Head: Normocephalic and atraumatic.  Eyes: Conjunctivae normal are normal. Pupils are equal, round, and reactive to light.  Neck: Normal range of motion. Neck supple.  Cardiovascular: Normal rate and regular rhythm.   Murmur heard. Pulmonary/Chest: Effort normal and breath sounds normal.   Abdominal: Soft. Bowel sounds are normal.          Assessment & Plan:  Discussed sleep apnea screening ( neck 20, obese male) Stable blood pressure Chronic prostate issues including BPH Lipids stable

## 2012-04-29 ENCOUNTER — Other Ambulatory Visit: Payer: Self-pay | Admitting: Internal Medicine

## 2012-05-08 ENCOUNTER — Other Ambulatory Visit: Payer: Self-pay | Admitting: Internal Medicine

## 2012-05-29 ENCOUNTER — Encounter: Payer: Self-pay | Admitting: Nurse Practitioner

## 2012-05-29 ENCOUNTER — Ambulatory Visit (INDEPENDENT_AMBULATORY_CARE_PROVIDER_SITE_OTHER): Payer: Medicare Other | Admitting: Nurse Practitioner

## 2012-05-29 VITALS — BP 150/80 | HR 55 | Ht 73.0 in | Wt 258.1 lb

## 2012-05-29 DIAGNOSIS — I10 Essential (primary) hypertension: Secondary | ICD-10-CM

## 2012-05-29 DIAGNOSIS — I259 Chronic ischemic heart disease, unspecified: Secondary | ICD-10-CM

## 2012-05-29 DIAGNOSIS — E785 Hyperlipidemia, unspecified: Secondary | ICD-10-CM

## 2012-05-29 LAB — LIPID PANEL
Cholesterol: 95 mg/dL (ref 0–200)
HDL: 23.8 mg/dL — ABNORMAL LOW (ref >39.00)
LDL Cholesterol: 41 mg/dL (ref 0–99)
Total CHOL/HDL Ratio: 4
Triglycerides: 149 mg/dL (ref 0.0–149.0)
VLDL: 29.8 mg/dL (ref 0.0–40.0)

## 2012-05-29 LAB — CBC WITH DIFFERENTIAL/PLATELET
Basophils Absolute: 0 10*3/uL (ref 0.0–0.1)
Basophils Relative: 0.4 % (ref 0.0–3.0)
Eosinophils Absolute: 0.1 10*3/uL (ref 0.0–0.7)
Eosinophils Relative: 1.5 % (ref 0.0–5.0)
HCT: 45.5 % (ref 39.0–52.0)
Hemoglobin: 15.6 g/dL (ref 13.0–17.0)
Lymphocytes Relative: 35.4 % (ref 12.0–46.0)
Lymphs Abs: 2.6 10*3/uL (ref 0.7–4.0)
MCHC: 34.3 g/dL (ref 30.0–36.0)
MCV: 92.6 fl (ref 78.0–100.0)
Monocytes Absolute: 0.4 10*3/uL (ref 0.1–1.0)
Monocytes Relative: 5.9 % (ref 3.0–12.0)
Neutro Abs: 4.2 10*3/uL (ref 1.4–7.7)
Neutrophils Relative %: 56.8 % (ref 43.0–77.0)
Platelets: 182 10*3/uL (ref 150.0–400.0)
RBC: 4.92 Mil/uL (ref 4.22–5.81)
RDW: 12.8 % (ref 11.5–14.6)
WBC: 7.4 10*3/uL (ref 4.5–10.5)

## 2012-05-29 LAB — HEPATIC FUNCTION PANEL
ALT: 24 U/L (ref 0–53)
AST: 20 U/L (ref 0–37)
Albumin: 4.3 g/dL (ref 3.5–5.2)
Alkaline Phosphatase: 49 U/L (ref 39–117)
Bilirubin, Direct: 0.2 mg/dL (ref 0.0–0.3)
Total Bilirubin: 1.2 mg/dL (ref 0.3–1.2)
Total Protein: 7.4 g/dL (ref 6.0–8.3)

## 2012-05-29 LAB — BASIC METABOLIC PANEL
BUN: 18 mg/dL (ref 6–23)
CO2: 27 mEq/L (ref 19–32)
Calcium: 9 mg/dL (ref 8.4–10.5)
Chloride: 101 mEq/L (ref 96–112)
Creatinine, Ser: 1.1 mg/dL (ref 0.4–1.5)
GFR: 69.7 mL/min (ref >60.00)
Glucose, Bld: 104 mg/dL — ABNORMAL HIGH (ref 70–99)
Potassium: 4.9 mEq/L (ref 3.5–5.1)
Sodium: 138 mEq/L (ref 135–145)

## 2012-05-29 NOTE — Patient Instructions (Signed)
Stay active  We will check labs today  Try to cut back on your salt use  See Dr. Johney Frame in 6 months  Monitor your blood pressure at home - keep a diary and take it to your visit with Dr. Lovell Sheehan later this month. Take your blood pressure cuff with you.  Call the [facility] office at [phone] if you have any questions, problems or concerns.

## 2012-05-29 NOTE — Progress Notes (Signed)
Wesley Johnson Date of Birth: Feb 27, 1941 Medical Record #[id number]  History of Present Illness: Wesley Johnson is seen back today for his 6 month check. He is seen for Dr. Johney Frame. He has HLD, HTN, BPH, DJD, CAD with past PCI of the ramus and nonobstructive disease in the LAD, LCX and RCA from cath in 2012.   Last seen here in October of 2013. Felt to be doing ok.  He comes in today. He is here with his son. Doing ok. Weight is up. Trying to walk a mile most days but the weather has been more prohibitive this winter. No angina. Does have some chest pain with moving/turning but nothing with exertion. BP is up. Son endorses too much salt. They eat out a lot. Sees his PCP in 2 weeks. He is fasting today. Not short of breath. No dizzy or passing out spells.    Current Outpatient Prescriptions on File Prior to Visit  Medication Sig Dispense Refill  . alclomethasone (ACLOVATE) 0.05 % cream Apply topically 2 (two) times daily.  45 g  6  . aspirin 81 MG chewable tablet Chew 81 mg by mouth daily.        . baclofen (LIORESAL) 10 MG tablet Take 10 mg by mouth at bedtime as needed.        Marland Kitchen BYSTOLIC 10 MG tablet TAKE ONE TABLET BY MOUTH EVERY DAY  30 tablet  0  . ciprofloxacin (CIPRO) 500 MG tablet Take 1 tablet (500 mg total) by mouth 2 (two) times daily.  20 tablet  0  . finasteride (PROSCAR) 5 MG tablet Take 1 tablet (5 mg total) by mouth daily.  90 tablet  3  . losartan (COZAAR) 100 MG tablet TAKE ONE TABLET BY MOUTH EVERY DAY  90 tablet  0  . nitroGLYCERIN (NITROSTAT) 0.4 MG SL tablet Place 0.4 mg under the tongue every 5 (five) minutes as needed.      . polyethylene glycol powder (GLYCOLAX/MIRALAX) powder Take 17 g by mouth daily.  255 g  0  . pravastatin (PRAVACHOL) 40 MG tablet Take 1 tablet (40 mg total) by mouth daily.  90 tablet  3  . promethazine (PHENERGAN) 25 MG tablet Take 1 tablet (25 mg total) by mouth every 6 (six) hours as needed.  30 tablet  0   No current facility-administered  medications on file prior to visit.    Allergies  Allergen Reactions  . Floxin (Ofloxacin)     N&v.    Past Medical History  Diagnosis Date  . Hyperlipidemia   . Hypertension   . BPH (benign prostatic hyperplasia)   . DJD (degenerative joint disease)   . Lumbar stenosis   . Tubular adenoma of colon 07/2005  . Diverticulosis   . Hemorrhoids   . CAD (coronary artery disease) 10/04/10    s/p PCI of the Ramus, nonobstructive disease of LAD, LCx, and RCA  . IBS (irritable bowel syndrome)   . Insomnia     Past Surgical History  Procedure Laterality Date  . Coronary angioplasty with stent placement  10/04/10    PCI Ramus with Promus DES    History  Smoking status  . Former Smoker  . Quit date: 09/30/1975  Smokeless tobacco  . Never Used    Comment: quit in 1977 smoked 1.5 -2 ppd    History  Alcohol Use No    Family History  Problem Relation Age of Onset  . Hypertension    . Colon polyps    .  Colon cancer Paternal Grandfather   . Stroke Mother   . Lung cancer Father     Review of Systems: The review of systems is per the HPI.  All other systems were reviewed and are negative.  Physical Exam: BP 150/80  Pulse 55  Ht 6\' 1"  (1.854 m)  Wt 258 lb 1.9 oz (117.082 kg)  BMI 34.06 kg/m2 Patient is very pleasant and in no acute distress. He is obese. Skin is warm and dry. Color is normal.  HEENT is unremarkable. Normocephalic/atraumatic. PERRL. Sclera are nonicteric. Neck is supple. No masses. No JVD. Lungs are clear. Cardiac exam shows a regular rate and rhythm. Abdomen is soft. Extremities are without edema. Gait and ROM are intact. No gross neurologic deficits noted.  LABORATORY DATA: Pending.  EKG today shows sinus bradycardia.   Lab Results  Component Value Date   WBC 9.0 10/05/2010   HGB 15.4 10/05/2010   HCT 42.9 10/05/2010   PLT 183 10/05/2010   GLUCOSE 93 12/19/2011   CHOL 104 12/19/2011   TRIG 155.0* 12/19/2011   HDL 24.50* 12/19/2011   LDLDIRECT 102.1  09/08/2009   LDLCALC 49 12/19/2011   ALT 22 12/19/2011   AST 20 12/19/2011   NA 136 12/19/2011   K 4.5 12/19/2011   CL 102 12/19/2011   CREATININE 1.0 12/19/2011   BUN 18 12/19/2011   CO2 28 12/19/2011   TSH 1.62 09/08/2009   PSA 0.60 09/18/2006   INR 1.02 10/04/2010   HGBA1C 5.5 12/06/2007    Assessment / Plan: 1. CAD - doing ok clinically. Needs CV risk factor modification. Encouraged more activity/weight loss/etc.  2. HTN - BP is up. He is willing to monitor at home. Seeing his PCP in 2 weeks. May need additional medicine. Encouraged salt restriction.   3. HLD - recheck labs today  4. Obesity - tried to encourage more activity.   We will see him back in 6 months.   Patient is agreeable to this plan and will call if any problems develop in the interim.   Rosalio Macadamia, RN, ANP-C [facility] [address] [place], [place]  [address]

## 2012-06-11 ENCOUNTER — Encounter: Payer: Self-pay | Admitting: Internal Medicine

## 2012-06-11 ENCOUNTER — Ambulatory Visit (INDEPENDENT_AMBULATORY_CARE_PROVIDER_SITE_OTHER): Payer: Medicare Other | Admitting: Internal Medicine

## 2012-06-11 VITALS — BP 122/72 | HR 56 | Temp 98.3°F | Resp 16 | Ht 73.0 in | Wt 255.0 lb

## 2012-06-11 DIAGNOSIS — M1610 Unilateral primary osteoarthritis, unspecified hip: Secondary | ICD-10-CM

## 2012-06-11 DIAGNOSIS — H68029 Chronic Eustachian salpingitis, unspecified ear: Secondary | ICD-10-CM

## 2012-06-11 DIAGNOSIS — M1990 Unspecified osteoarthritis, unspecified site: Secondary | ICD-10-CM

## 2012-06-11 DIAGNOSIS — I10 Essential (primary) hypertension: Secondary | ICD-10-CM

## 2012-06-11 DIAGNOSIS — M169 Osteoarthritis of hip, unspecified: Secondary | ICD-10-CM

## 2012-06-11 MED ORDER — METHYLPREDNISOLONE ACETATE 40 MG/ML IJ SUSP: 40.0000 mg | Freq: Once | INTRAMUSCULAR | Status: DC

## 2012-06-11 MED ORDER — DICLOFENAC SODIUM 1 % TD GEL: 2.0000 g | Freq: Four times a day (QID) | TRANSDERMAL | Status: DC

## 2012-06-11 MED ORDER — LOSARTAN POTASSIUM 100 MG PO TABS: ORAL_TABLET | ORAL | Status: DC

## 2012-06-11 NOTE — Progress Notes (Signed)
Subjective:    Patient ID: Wesley Johnson, male    DOB: 18-Jan-1941, 72 y.o.   MRN: [id number]  HPI Blood pressure were "high" at cardiology but today the reading are good bystolic samples to aid compliance and kee blood pressure in control    Review of Systems  Constitutional: Negative for fever and fatigue.  HENT: Negative for hearing loss, congestion, neck pain and postnasal drip.   Eyes: Negative for discharge, redness and visual disturbance.  Respiratory: Negative for cough, shortness of breath and wheezing.   Cardiovascular: Negative for leg swelling.  Gastrointestinal: Negative for abdominal pain, constipation and abdominal distention.  Genitourinary: Negative for urgency and frequency.  Musculoskeletal: Negative for joint swelling and arthralgias.  Skin: Negative for color change and rash.  Neurological: Negative for weakness and light-headedness.  Hematological: Negative for adenopathy.  Psychiatric/Behavioral: Negative for behavioral problems.   Past Medical History  Diagnosis Date  . Hyperlipidemia   . Hypertension   . BPH (benign prostatic hyperplasia)   . DJD (degenerative joint disease)   . Lumbar stenosis   . Tubular adenoma of colon 07/2005  . Diverticulosis   . Hemorrhoids   . CAD (coronary artery disease) 10/04/10    s/p PCI of the Ramus, nonobstructive disease of LAD, LCx, and RCA  . IBS (irritable bowel syndrome)   . Insomnia     History   Social History  . Marital Status: Widowed    Spouse Name: N/A    Number of Children: 2  . Years of Education: N/A   Occupational History  . Retired     Social History Main Topics  . Smoking status: Former Smoker    Quit date: 09/30/1975  . Smokeless tobacco: Never Used     Comment: quit in 1977 smoked 1.5 -2 ppd  . Alcohol Use: No  . Drug Use: No  . Sexually Active: Yes   Other Topics Concern  . Not on file   Social History Narrative   Lives in [place] with son.  Widowed.   Pt is retired from a  [employer].    Past Surgical History  Procedure Laterality Date  . Coronary angioplasty with stent placement  10/04/10    PCI Ramus with Promus DES    Family History  Problem Relation Age of Onset  . Hypertension    . Colon polyps    . Colon cancer Paternal Grandfather   . Stroke Mother   . Lung cancer Father     Allergies  Allergen Reactions  . Floxin (Ofloxacin)     N&v.    Current Outpatient Prescriptions on File Prior to Visit  Medication Sig Dispense Refill  . alclomethasone (ACLOVATE) 0.05 % cream Apply topically 2 (two) times daily.  45 g  6  . aspirin 81 MG chewable tablet Chew 81 mg by mouth daily.        . baclofen (LIORESAL) 10 MG tablet Take 10 mg by mouth at bedtime as needed.        Marland Kitchen BYSTOLIC 10 MG tablet TAKE ONE TABLET BY MOUTH EVERY DAY  30 tablet  0  . ciprofloxacin (CIPRO) 500 MG tablet Take 1 tablet (500 mg total) by mouth 2 (two) times daily.  20 tablet  0  . finasteride (PROSCAR) 5 MG tablet Take 1 tablet (5 mg total) by mouth daily.  90 tablet  3  . nitroGLYCERIN (NITROSTAT) 0.4 MG SL tablet Place 0.4 mg under the tongue every 5 (five) minutes as needed.      Marland Kitchen  polyethylene glycol powder (GLYCOLAX/MIRALAX) powder Take 17 g by mouth daily.  255 g  0  . pravastatin (PRAVACHOL) 40 MG tablet Take 1 tablet (40 mg total) by mouth daily.  90 tablet  3  . promethazine (PHENERGAN) 25 MG tablet Take 1 tablet (25 mg total) by mouth every 6 (six) hours as needed.  30 tablet  0   No current facility-administered medications on file prior to visit.    BP 122/72  Pulse 56  Temp(Src) 98.3 F (36.8 C)  Resp 16  Ht 6\' 1"  (1.854 m)  Wt 255 lb (115.667 kg)  BMI 33.65 kg/m2        Objective:   Physical Exam  Nursing note and vitals reviewed. Constitutional: He appears well-developed and well-nourished.  HENT:  Head: Normocephalic and atraumatic.  Ear fullness and redness in the canals  Eyes: Conjunctivae are normal. Pupils are equal, round, and reactive  to light.  Neck: Normal range of motion. Neck supple.  Cardiovascular: Normal rate and regular rhythm.   Murmur heard. Pulmonary/Chest: Effort normal and breath sounds normal.  Abdominal: Soft. Bowel sounds are normal.          Assessment & Plan:  And has  symptoms of eustachian tube dysfunction with pressure behind ears bilaterally and bilateral ear pain will trya nasal spray I will give him samples to see if it will effectively relieved the discomfort.  The patient's blood pressure stable his current medications despite the reading in cardiology today his blood pressure is excellent was rechecked we'll give him samples of diastolic.  Informed consent obtained and the patient's left hip was prepped with betadine. Local anesthesia was obtained with topical spray. Then 40 mg of Depo-Medrol and 1/2 cc of lidocaine was injected into the joint space. The patient tolerated the procedure without complications. Post injection care discussed with patient.

## 2012-06-11 NOTE — Patient Instructions (Signed)
The patient is instructed to continue all medications as prescribed. Schedule followup with check out clerk upon leaving the clinic  

## 2012-06-14 ENCOUNTER — Encounter: Payer: Self-pay | Admitting: Gastroenterology

## 2012-08-01 ENCOUNTER — Other Ambulatory Visit: Payer: Self-pay | Admitting: Internal Medicine

## 2012-09-09 ENCOUNTER — Other Ambulatory Visit: Payer: Self-pay | Admitting: Internal Medicine

## 2012-10-03 ENCOUNTER — Other Ambulatory Visit: Payer: Self-pay

## 2012-10-12 ENCOUNTER — Ambulatory Visit (INDEPENDENT_AMBULATORY_CARE_PROVIDER_SITE_OTHER): Payer: Medicare Other | Admitting: Internal Medicine

## 2012-10-12 ENCOUNTER — Encounter: Payer: Self-pay | Admitting: Internal Medicine

## 2012-10-12 VITALS — BP 135/88 | HR 64 | Temp 98.2°F | Resp 16 | Ht 73.0 in | Wt 260.0 lb

## 2012-10-12 DIAGNOSIS — E785 Hyperlipidemia, unspecified: Secondary | ICD-10-CM

## 2012-10-12 DIAGNOSIS — I10 Essential (primary) hypertension: Secondary | ICD-10-CM

## 2012-10-12 DIAGNOSIS — M5030 Other cervical disc degeneration, unspecified cervical region: Secondary | ICD-10-CM

## 2012-10-12 DIAGNOSIS — N400 Enlarged prostate without lower urinary tract symptoms: Secondary | ICD-10-CM

## 2012-10-12 LAB — BASIC METABOLIC PANEL
Chloride: 99 mEq/L (ref 96–112)
Creatinine, Ser: 1.2 mg/dL (ref 0.4–1.5)
GFR: 63.63 mL/min (ref >60.00)

## 2012-10-12 MED ORDER — FINASTERIDE 5 MG PO TABS: 5.0000 mg | ORAL_TABLET | Freq: Every day | ORAL | Status: DC

## 2012-10-12 MED ORDER — PRAVASTATIN SODIUM 40 MG PO TABS: 40.0000 mg | ORAL_TABLET | Freq: Every day | ORAL | Status: DC

## 2012-10-12 MED ORDER — LOSARTAN POTASSIUM 100 MG PO TABS: ORAL_TABLET | ORAL | Status: DC

## 2012-10-12 NOTE — Progress Notes (Signed)
Subjective:    Patient ID: Wesley Johnson, male    DOB: November 21, 1940, 72 y.o.   MRN: [id number]  HPI  Has degenerative arthritis of the hips.  Air injection in his right hip that alleviated some of his discomfort he now has more pain in the left hip and is requesting an injection.  He stopped hypertension a recheck of his blood pressure shows that it was 135/80 87 Stressful Dr. Over here and attributes that to the initial elevated blood pressure reading.  Patient's dizziness is largely resolved he needs refills on certain medications which have been processed and sent to his pharmacy  Review of Systems  Constitutional: Negative for fever and fatigue.  HENT: Negative for hearing loss, congestion, neck pain and postnasal drip.   Eyes: Negative for discharge, redness and visual disturbance.  Respiratory: Negative for cough, shortness of breath and wheezing.   Cardiovascular: Negative for leg swelling.  Gastrointestinal: Negative for abdominal pain, constipation and abdominal distention.  Genitourinary: Negative for urgency and frequency.  Musculoskeletal: Positive for myalgias, arthralgias and gait problem. Negative for joint swelling.  Skin: Negative for color change and rash.  Neurological: Positive for weakness. Negative for light-headedness.  Hematological: Negative for adenopathy.  Psychiatric/Behavioral: Negative for behavioral problems.   . Past Medical History  Diagnosis Date  . Hyperlipidemia   . Hypertension   . BPH (benign prostatic hyperplasia)   . DJD (degenerative joint disease)   . Lumbar stenosis   . Tubular adenoma of colon 07/2005  . Diverticulosis   . Hemorrhoids   . CAD (coronary artery disease) 10/04/10    s/p PCI of the Ramus, nonobstructive disease of LAD, LCx, and RCA  . IBS (irritable bowel syndrome)   . Insomnia     History   Social History  . Marital Status: Widowed    Spouse Name: N/A    Number of Children: 2  . Years of Education: N/A    Occupational History  . Retired     Social History Main Topics  . Smoking status: Former Smoker    Quit date: 09/30/1975  . Smokeless tobacco: Never Used     Comment: quit in 1977 smoked 1.5 -2 ppd  . Alcohol Use: No  . Drug Use: No  . Sexual Activity: Yes   Other Topics Concern  . Not on file   Social History Narrative   Lives in [place] with son.  Widowed.   Pt is retired from a [employer].    Past Surgical History  Procedure Laterality Date  . Coronary angioplasty with stent placement  10/04/10    PCI Ramus with Promus DES    Family History  Problem Relation Age of Onset  . Hypertension    . Colon polyps    . Colon cancer Paternal Grandfather   . Stroke Mother   . Lung cancer Father     Allergies  Allergen Reactions  . Floxin [Ofloxacin]     N&v.    Current Outpatient Prescriptions on File Prior to Visit  Medication Sig Dispense Refill  . alclomethasone (ACLOVATE) 0.05 % cream APPLY  CREAM TWICE DAILY  45 g  6  . aspirin 81 MG chewable tablet Chew 81 mg by mouth daily.        . baclofen (LIORESAL) 10 MG tablet Take 10 mg by mouth at bedtime as needed.        Marland Kitchen BYSTOLIC 10 MG tablet TAKE ONE TABLET BY MOUTH EVERY DAY  30 tablet  0  .  diclofenac sodium (VOLTAREN) 1 % GEL Apply 2 g topically 4 (four) times daily.  200 g  4  . nitroGLYCERIN (NITROSTAT) 0.4 MG SL tablet Place 0.4 mg under the tongue every 5 (five) minutes as needed.      . polyethylene glycol powder (GLYCOLAX/MIRALAX) powder Take 17 g by mouth daily.  255 g  0  . promethazine (PHENERGAN) 25 MG tablet Take 1 tablet (25 mg total) by mouth every 6 (six) hours as needed.  30 tablet  0   No current facility-administered medications on file prior to visit.    BP 135/88  Pulse 64  Temp(Src) 98.2 F (36.8 C)  Resp 16  Ht 6\' 1"  (1.854 m)  Wt 260 lb (117.935 kg)  BMI 34.31 kg/m2       Objective:   Physical Exam  Constitutional: He appears well-developed and well-nourished.  HENT:   Head: Normocephalic and atraumatic.  Eyes: Conjunctivae are normal. Pupils are equal, round, and reactive to light.  Neck: Normal range of motion. Neck supple.  Cardiovascular: Normal rate and regular rhythm.   Pulmonary/Chest: Effort normal and breath sounds normal.  Abdominal: Soft. Bowel sounds are normal.  Musculoskeletal: He exhibits edema and tenderness.          Assessment & Plan:   Left hip pain we will give an injection of lidocaine and Depo-Medrol.  Stable hypertension.basic metabolic panel will be obtained today.  Patient's lipids were reviewed from April he will have a complete physical examination in April 2015.

## 2012-11-14 ENCOUNTER — Encounter: Payer: Self-pay | Admitting: Gastroenterology

## 2012-12-08 ENCOUNTER — Other Ambulatory Visit: Payer: Self-pay | Admitting: Internal Medicine

## 2012-12-24 ENCOUNTER — Ambulatory Visit (INDEPENDENT_AMBULATORY_CARE_PROVIDER_SITE_OTHER): Payer: Medicare Other | Admitting: Internal Medicine

## 2012-12-24 ENCOUNTER — Encounter: Payer: Self-pay | Admitting: Internal Medicine

## 2012-12-24 VITALS — BP 122/78 | HR 74 | Ht 73.0 in | Wt 259.4 lb

## 2012-12-24 DIAGNOSIS — I2510 Atherosclerotic heart disease of native coronary artery without angina pectoris: Secondary | ICD-10-CM

## 2012-12-24 DIAGNOSIS — I10 Essential (primary) hypertension: Secondary | ICD-10-CM

## 2012-12-24 DIAGNOSIS — E785 Hyperlipidemia, unspecified: Secondary | ICD-10-CM

## 2012-12-24 NOTE — Patient Instructions (Addendum)
Your physician wants you to follow-up in: 6 months with Lori Gerhardt, NP and 12 months with Dr Allred You will receive a reminder letter in the mail two months in advance. If you don't receive a letter, please call our office to schedule the follow-up appointment.  

## 2012-12-24 NOTE — Progress Notes (Signed)
PCP: Carrie Mew, MD  Wesley Johnson is a 72 y.o. male who presents today for routine cardiology followup.  Since last being seen in our clinic, the patient reports doing very well.  Unfortunately, he has not been as diligent with his exercise routine as previous.  He may walk a mile a week at most.  Today, he denies symptoms of palpitations, chest pain, shortness of breath,  lower extremity edema, dizziness, presyncope, or syncope.  He has occasional leg cramps at night.  He also reports bruising with plavix.  The patient is otherwise without complaint today.   Past Medical History  Diagnosis Date  . Hyperlipidemia   . Hypertension   . BPH (benign prostatic hyperplasia)   . DJD (degenerative joint disease)   . Lumbar stenosis   . Tubular adenoma of colon 07/2005  . Diverticulosis   . Hemorrhoids   . CAD (coronary artery disease) 10/04/10    s/p PCI of the Ramus, nonobstructive disease of LAD, LCx, and RCA  . IBS (irritable bowel syndrome)   . Insomnia    Past Surgical History  Procedure Laterality Date  . Coronary angioplasty with stent placement  10/04/10    PCI Ramus with Promus DES    Current Outpatient Prescriptions  Medication Sig Dispense Refill  . alclomethasone (ACLOVATE) 0.05 % cream APPLY  CREAM TWICE DAILY  45 g  6  . aspirin 81 MG chewable tablet Chew 81 mg by mouth daily.        Marland Kitchen BYSTOLIC 10 MG tablet TAKE ONE TABLET BY MOUTH ONCE DAILY  30 tablet  6  . finasteride (PROSCAR) 5 MG tablet Take 1 tablet (5 mg total) by mouth daily.  90 tablet  3  . losartan (COZAAR) 100 MG tablet TAKE ONE TABLET BY MOUTH EVERY DAY  90 tablet  3  . nitroGLYCERIN (NITROSTAT) 0.4 MG SL tablet Place 0.4 mg under the tongue every 5 (five) minutes as needed.      . polyethylene glycol powder (GLYCOLAX/MIRALAX) powder Take 17 g by mouth as needed.      . pravastatin (PRAVACHOL) 40 MG tablet Take 1 tablet (40 mg total) by mouth daily.  90 tablet  3  . promethazine (PHENERGAN) 25 MG tablet  Take 1 tablet (25 mg total) by mouth every 6 (six) hours as needed.  30 tablet  0  . baclofen (LIORESAL) 10 MG tablet Take 10 mg by mouth at bedtime as needed.        . diclofenac sodium (VOLTAREN) 1 % GEL Apply 2 g topically 4 (four) times daily.  200 g  4   No current facility-administered medications for this visit.    Physical Exam: Filed Vitals:   12/24/12 1553  BP: 122/78  Pulse: 74  Height: 6\' 1"  (1.854 m)  Weight: 259 lb 6.4 oz (117.663 kg)    GEN- The patient is well appearing, alert and oriented x 3 today.   Head- normocephalic, atraumatic Eyes-  Sclera clear, conjunctiva pink Ears- hearing intact Oropharynx- clear Lungs- Clear to ausculation bilaterally, normal work of breathing Heart- Regular rate and rhythm, no murmurs, rubs or gallops, PMI not laterally displaced GI- soft, NT, ND, + BS Extremities- no clubbing, cyanosis, or edema  ekg today reveals sinus rhythm 74 bpm with PVCs,  LVH, septal infarction, otherwise normal ekg  Assessment and Plan:  1. CAD Stable No change required today  2. HL Stable No change required today  3. HTN Stable No change required today  4. Obesity Weight loss and regular exercise are encouraged  Return to see Norma Fredrickson in 6 months I will see in a year

## 2013-01-03 ENCOUNTER — Other Ambulatory Visit: Payer: Self-pay

## 2013-01-17 ENCOUNTER — Ambulatory Visit (AMBULATORY_SURGERY_CENTER): Payer: Self-pay

## 2013-01-17 VITALS — Ht 73.0 in | Wt 265.0 lb

## 2013-01-17 DIAGNOSIS — Z1211 Encounter for screening for malignant neoplasm of colon: Secondary | ICD-10-CM

## 2013-01-17 MED ORDER — MOVIPREP 100 G PO SOLR: 1.0000 | Freq: Once | ORAL | Status: DC

## 2013-01-18 ENCOUNTER — Encounter: Payer: Self-pay | Admitting: Gastroenterology

## 2013-01-30 ENCOUNTER — Telehealth: Payer: Self-pay | Admitting: Gastroenterology

## 2013-01-30 NOTE — Telephone Encounter (Signed)
Called patient and no answer.  Left message on answering machine for patient to complete prep as instructed and proceed with colonoscopy tomorrow as scheduled.  I also advised him that if the lower abdominal pain is severe and worsens, to contact his primary care physician for advise on what to do.  Left the office number to admitting for patient to call if he had any further questions or concerns.

## 2013-01-31 ENCOUNTER — Encounter: Payer: Self-pay | Admitting: *Deleted

## 2013-01-31 ENCOUNTER — Encounter: Payer: Medicare Other | Admitting: Gastroenterology

## 2013-01-31 NOTE — Telephone Encounter (Signed)
error 

## 2013-02-15 ENCOUNTER — Ambulatory Visit (INDEPENDENT_AMBULATORY_CARE_PROVIDER_SITE_OTHER): Payer: Medicare Other

## 2013-02-15 DIAGNOSIS — Z23 Encounter for immunization: Secondary | ICD-10-CM

## 2013-03-05 ENCOUNTER — Other Ambulatory Visit: Payer: Self-pay | Admitting: *Deleted

## 2013-03-05 MED ORDER — FINASTERIDE 5 MG PO TABS: 5.0000 mg | ORAL_TABLET | Freq: Every day | ORAL | Status: AC

## 2013-03-05 MED ORDER — LOSARTAN POTASSIUM 100 MG PO TABS: ORAL_TABLET | ORAL | Status: AC

## 2013-03-05 MED ORDER — PRAVASTATIN SODIUM 40 MG PO TABS: 40.0000 mg | ORAL_TABLET | Freq: Every day | ORAL | Status: DC

## 2013-04-30 ENCOUNTER — Telehealth: Payer: Self-pay | Admitting: Internal Medicine

## 2013-04-30 ENCOUNTER — Other Ambulatory Visit: Payer: Self-pay | Admitting: *Deleted

## 2013-04-30 DIAGNOSIS — R200 Anesthesia of skin: Secondary | ICD-10-CM

## 2013-04-30 NOTE — Telephone Encounter (Signed)
Pt needs referral to dr allred, [facility]. Pt has been a pt of dr allred several yrs. But he has [employer]. Pt is having left arm numbness and has seen dr allred for this before. No breathing issues. Prefers to see dr allred first for this issue asap.

## 2013-04-30 NOTE — Telephone Encounter (Signed)
Sent                  Referral for urgent for referral to dr allred.pt informed

## 2013-05-01 ENCOUNTER — Telehealth: Payer: Self-pay | Admitting: Internal Medicine

## 2013-05-01 ENCOUNTER — Encounter: Payer: Self-pay | Admitting: Family Medicine

## 2013-05-01 ENCOUNTER — Ambulatory Visit (INDEPENDENT_AMBULATORY_CARE_PROVIDER_SITE_OTHER): Payer: Medicare HMO | Admitting: Family Medicine

## 2013-05-01 VITALS — BP 138/66 | HR 67 | Temp 98.2°F | Ht 73.0 in | Wt 260.0 lb

## 2013-05-01 DIAGNOSIS — M542 Cervicalgia: Secondary | ICD-10-CM

## 2013-05-01 DIAGNOSIS — R209 Unspecified disturbances of skin sensation: Secondary | ICD-10-CM

## 2013-05-01 DIAGNOSIS — R202 Paresthesia of skin: Secondary | ICD-10-CM

## 2013-05-01 DIAGNOSIS — R200 Anesthesia of skin: Secondary | ICD-10-CM

## 2013-05-01 MED ORDER — PROMETHAZINE HCL 25 MG PO TABS: 25.0000 mg | ORAL_TABLET | Freq: Four times a day (QID) | ORAL | Status: DC | PRN

## 2013-05-01 MED ORDER — CIPROFLOXACIN HCL 500 MG PO TABS: 500.0000 mg | ORAL_TABLET | Freq: Two times a day (BID) | ORAL | Status: DC

## 2013-05-01 MED ORDER — METHOCARBAMOL 500 MG PO TABS: 500.0000 mg | ORAL_TABLET | Freq: Four times a day (QID) | ORAL | Status: DC | PRN

## 2013-05-01 MED ORDER — DICLOFENAC SODIUM 75 MG PO TBEC: 75.0000 mg | DELAYED_RELEASE_TABLET | Freq: Two times a day (BID) | ORAL | Status: DC

## 2013-05-01 NOTE — Telephone Encounter (Signed)
New Message:  PT is c/o numbness in his arm... I informed pt the soonest appt Dr. Rayann Heman has is 4/17 and the soonest with our PA's/NP's is middle of March... Pt is asking to be worked in somewhere.

## 2013-05-01 NOTE — Telephone Encounter (Signed)
New Prob   Pt states he has decided to see his PCP for the numbness in his left arm. Wanted to notify office.

## 2013-05-01 NOTE — Progress Notes (Signed)
   Subjective:    Patient ID: Wesley Johnson, male    DOB: 02-Apr-1940, 73 y.o.   MRN: [id number]  HPI Here for 3 days of tingling and numbness down the left arm. He no pain in the arm. No chest pain or SOB. He has a hx of degenerative disc disease in the spine, and he has had chronic neck pain. He saw Dr. Nelva Bush a few times some years ago for shots in the neck area. From what I can see in the chart the last time his cervical spine was imaged was with an MRI in 2009. This showed degenerated discs and some early spurring.    Review of Systems  Constitutional: Negative.   Respiratory: Negative.   Cardiovascular: Negative.   Musculoskeletal: Positive for neck pain.  Neurological: Positive for numbness. Negative for weakness.       Objective:   Physical Exam  Constitutional: He is oriented to person, place, and time. He appears well-developed and well-nourished. No distress.  Neck:  He is very tender in the posterior neck, in the left trapezius, and over the left scapula. Reduced ROM of the neck  Cardiovascular: Normal rate, regular rhythm, normal heart sounds and intact distal pulses.   Pulmonary/Chest: Effort normal and breath sounds normal.  Neurological: He is alert and oriented to person, place, and time. No cranial nerve deficit. He exhibits normal muscle tone. Coordination normal.          Assessment & Plan:  He is having some pinching of the nerves in the neck. Try heat, Diclofenac, and Robaxin. Refer back to Dr. Nelva Bush

## 2013-05-01 NOTE — Progress Notes (Signed)
Pre visit review using our clinic review tool, if applicable. No additional management support is needed unless otherwise documented below in the visit note. 

## 2013-05-01 NOTE — Telephone Encounter (Signed)
Patient Information:  Caller Name: Wesley Johnson  Phone: [phone]  Patient: Wesley Johnson, Wesley Johnson  Gender: Male  DOB: Jun 25, 1940  Age: 73 Years  PCP: Alysia Pen[facility])  Office Follow Up:  Does the office need to follow up with this patient?: No  Instructions For The Office: N/A   Symptoms  Reason For Call & Symptoms: Wesley Johnson states he was seen in office on 05/01/13 due to numbness and tingling in arm x 3 days. Was ordered Voltaren /Diclofenac and Methocarbamol/ Robaxin. Asking can he take both medications at the same time?  Per [facility] Voltaren And Robaxin have no drug interactions. Advised to take Voltaren twice a day as ordered. May take Robaxin every 6 hours prn muscle spasms. No further questions.  Reviewed Health History In EMR: Yes  Reviewed Medications In EMR: Yes  Reviewed Allergies In EMR: Yes  Reviewed Surgeries / Procedures: Yes  Date of Onset of Symptoms: 04/28/2013  Guideline(s) Used:  No Protocol Available - Sick Adult  Disposition Per Guideline:   Home Care  Reason For Disposition Reached:   Patient's symptoms are safe to treat at home per nursing judgment  Advice Given:  Call Back If:  New symptoms develop  You become worse.  Patient Will Follow Care Advice:  YES

## 2013-05-02 NOTE — Telephone Encounter (Signed)
Noted  

## 2013-05-06 ENCOUNTER — Telehealth: Payer: Self-pay | Admitting: Internal Medicine

## 2013-05-06 NOTE — Telephone Encounter (Signed)
Pt needs a referral to dr [facility] not [facility].

## 2013-05-08 NOTE — Telephone Encounter (Signed)
S/w patient and explained to him that [facility] didn't accept his insurance, he now would like to go to [facility]. Faxed referral to Faxed order to [place], [place], [address]  They will contact patient directly to schedule.   Pt aware.

## 2013-05-08 NOTE — Telephone Encounter (Signed)
Pt following up on referral to [facility] , Dr Denzil [address] Pt is in a lot of pan and would like appt asap

## 2013-05-21 ENCOUNTER — Other Ambulatory Visit: Payer: Self-pay | Admitting: Orthopedic Surgery

## 2013-05-21 DIAGNOSIS — M542 Cervicalgia: Secondary | ICD-10-CM

## 2013-05-30 ENCOUNTER — Ambulatory Visit
Admission: RE | Admit: 2013-05-30 | Discharge: 2013-05-30 | Disposition: A | Discharge status: Home / Self Care | Payer: Commercial Managed Care - HMO | Source: Ambulatory Visit | Attending: Orthopedic Surgery | Admitting: Orthopedic Surgery

## 2013-05-30 DIAGNOSIS — M542 Cervicalgia: Secondary | ICD-10-CM

## 2013-06-05 ENCOUNTER — Telehealth: Payer: Self-pay | Admitting: Gastroenterology

## 2013-06-05 NOTE — Telephone Encounter (Signed)
Message copied by Oliva Bustard on Wed Jun 05, 2013  8:34 AM ------      Message from: [place] Bailiff D      Created: Thu Jan 31, 2013  1:29 PM      Regarding: FW: insurance                   ----- Message -----         From: Ladene Artist, MD         Sent: 01/30/2013   4:20 PM           To: April D Mirts, RN      Subject: RE: insurance                                            Please charge for late cancellation.            ----- Message -----         From: April D Mirts, RN         Sent: 01/30/2013   4:00 PM           To: Ladene Artist, MD      Subject: insurance                                                Pt called and cancelled colonoscopy due to insurance not paying for procedure. Pt states he will have a new insurance at the beginning of the year and he will call back and reschedule at that time. Pt was aware of cancellation policy.        ------

## 2013-06-10 ENCOUNTER — Ambulatory Visit (INDEPENDENT_AMBULATORY_CARE_PROVIDER_SITE_OTHER): Payer: Commercial Managed Care - HMO | Admitting: Internal Medicine

## 2013-06-10 ENCOUNTER — Encounter: Payer: Self-pay | Admitting: Internal Medicine

## 2013-06-10 VITALS — BP 130/70 | HR 58 | Temp 97.6°F | Ht 73.0 in | Wt 265.0 lb

## 2013-06-10 DIAGNOSIS — M25552 Pain in left hip: Secondary | ICD-10-CM

## 2013-06-10 DIAGNOSIS — T887XXA Unspecified adverse effect of drug or medicament, initial encounter: Secondary | ICD-10-CM

## 2013-06-10 DIAGNOSIS — N400 Enlarged prostate without lower urinary tract symptoms: Secondary | ICD-10-CM

## 2013-06-10 DIAGNOSIS — M5030 Other cervical disc degeneration, unspecified cervical region: Secondary | ICD-10-CM

## 2013-06-10 DIAGNOSIS — M1610 Unilateral primary osteoarthritis, unspecified hip: Secondary | ICD-10-CM

## 2013-06-10 DIAGNOSIS — I10 Essential (primary) hypertension: Secondary | ICD-10-CM

## 2013-06-10 DIAGNOSIS — M25559 Pain in unspecified hip: Secondary | ICD-10-CM

## 2013-06-10 DIAGNOSIS — E785 Hyperlipidemia, unspecified: Secondary | ICD-10-CM

## 2013-06-10 DIAGNOSIS — M542 Cervicalgia: Secondary | ICD-10-CM

## 2013-06-10 DIAGNOSIS — M1990 Unspecified osteoarthritis, unspecified site: Secondary | ICD-10-CM

## 2013-06-10 DIAGNOSIS — Z23 Encounter for immunization: Secondary | ICD-10-CM

## 2013-06-10 LAB — CBC WITH DIFFERENTIAL/PLATELET
BASOS PCT: 0.4 % (ref 0.0–3.0)
Basophils Absolute: 0 10*3/uL (ref 0.0–0.1)
EOS PCT: 1.1 % (ref 0.0–5.0)
Eosinophils Absolute: 0.1 10*3/uL (ref 0.0–0.7)
HEMATOCRIT: 47.5 % (ref 39.0–52.0)
Hemoglobin: 16 g/dL (ref 13.0–17.0)
LYMPHS ABS: 2.3 10*3/uL (ref 0.7–4.0)
Lymphocytes Relative: 31.3 % (ref 12.0–46.0)
MCHC: 33.7 g/dL (ref 30.0–36.0)
MCV: 94.2 fl (ref 78.0–100.0)
MONO ABS: 0.4 10*3/uL (ref 0.1–1.0)
MONOS PCT: 5.3 % (ref 3.0–12.0)
Neutro Abs: 4.5 10*3/uL (ref 1.4–7.7)
Neutrophils Relative %: 61.9 % (ref 43.0–77.0)
Platelets: 186 10*3/uL (ref 150.0–400.0)
RBC: 5.04 Mil/uL (ref 4.22–5.81)
RDW: 13 % (ref 11.5–14.6)
WBC: 7.2 10*3/uL (ref 4.5–10.5)

## 2013-06-10 LAB — POCT URINALYSIS DIPSTICK
Bilirubin, UA: NEGATIVE
Glucose, UA: NEGATIVE
Ketones, UA: NEGATIVE
Leukocytes, UA: NEGATIVE
Nitrite, UA: NEGATIVE
PROTEIN UA: NEGATIVE
SPEC GRAV UA: 1.02
UROBILINOGEN UA: 0.2
pH, UA: 6

## 2013-06-10 LAB — LIPID PANEL
CHOL/HDL RATIO: 4
Cholesterol: 101 mg/dL (ref 0–200)
HDL: 24.7 mg/dL — ABNORMAL LOW (ref >39.00)
LDL Cholesterol: 46 mg/dL (ref 0–99)
Triglycerides: 151 mg/dL — ABNORMAL HIGH (ref 0.0–149.0)
VLDL: 30.2 mg/dL (ref 0.0–40.0)

## 2013-06-10 LAB — BASIC METABOLIC PANEL
BUN: 19 mg/dL (ref 6–23)
CO2: 27 meq/L (ref 19–32)
Calcium: 9.1 mg/dL (ref 8.4–10.5)
Chloride: 101 mEq/L (ref 96–112)
Creatinine, Ser: 1.1 mg/dL (ref 0.4–1.5)
GFR: 70.96 mL/min (ref >60.00)
Glucose, Bld: 95 mg/dL (ref 70–99)
POTASSIUM: 4.6 meq/L (ref 3.5–5.1)
SODIUM: 137 meq/L (ref 135–145)

## 2013-06-10 LAB — PSA: PSA: 1.04 ng/mL (ref 0.10–4.00)

## 2013-06-10 LAB — HEPATIC FUNCTION PANEL
ALBUMIN: 4.1 g/dL (ref 3.5–5.2)
ALK PHOS: 51 U/L (ref 39–117)
ALT: 30 U/L (ref 0–53)
AST: 26 U/L (ref 0–37)
Bilirubin, Direct: 0.2 mg/dL (ref 0.0–0.3)
TOTAL PROTEIN: 7.1 g/dL (ref 6.0–8.3)
Total Bilirubin: 1 mg/dL (ref 0.3–1.2)

## 2013-06-10 LAB — TSH: TSH: 1.79 u[IU]/mL (ref 0.35–5.50)

## 2013-06-10 MED ORDER — METHYLPREDNISOLONE ACETATE 40 MG/ML IJ SUSP: 40.0000 mg | Freq: Once | INTRAMUSCULAR | Status: DC

## 2013-06-10 MED ORDER — TETANUS TOXOID ADSORBED 5 LFU IM SOLN: 0.5000 mL | Freq: Once | INTRAMUSCULAR | Status: DC

## 2013-06-10 MED ORDER — DOXYCYCLINE HYCLATE 100 MG PO TABS: 100.0000 mg | ORAL_TABLET | Freq: Two times a day (BID) | ORAL | Status: DC

## 2013-06-10 MED ORDER — PNEUMOCOCCAL VAC POLYVALENT 25 MCG/0.5ML IJ INJ: 0.5000 mL | INJECTION | INTRAMUSCULAR | Status: DC

## 2013-06-10 NOTE — Progress Notes (Signed)
   Subjective:    Patient ID: Wesley Johnson, male    DOB: 06/23/1940, 73 y.o.   MRN: [id number]  HPI Patient MA and presents for CPX Current issues include HTN in setting of CAD Chronic back pain BPH and chronic prostate issues Recent MRI of neck ordered by [facility]  Wesley Johnson has done injection in the past   Review of Systems  Constitutional: Positive for fatigue. Negative for fever.  HENT: Negative for congestion, hearing loss and postnasal drip.   Eyes: Negative for discharge, redness and visual disturbance.  Respiratory: Negative for cough, shortness of breath and wheezing.   Cardiovascular: Negative for leg swelling.  Gastrointestinal: Negative for abdominal pain, constipation and abdominal distention.  Genitourinary: Negative for urgency and frequency.  Musculoskeletal: Positive for back pain, gait problem, neck pain and neck stiffness. Negative for arthralgias and joint swelling.  Skin: Negative for color change and rash.  Neurological: Negative for weakness and light-headedness.  Hematological: Negative for adenopathy.  Psychiatric/Behavioral: Negative for behavioral problems.       Objective:   Physical Exam  Constitutional: He is oriented to person, place, and time. He appears well-developed and well-nourished.  HENT:  Head: Normocephalic and atraumatic.  Eyes: Conjunctivae are normal. Pupils are equal, round, and reactive to light.  Neck: Normal range of motion. Neck supple.  Cardiovascular: Normal rate, regular rhythm and intact distal pulses.   Pulmonary/Chest: Effort normal and breath sounds normal.  Abdominal: Soft. Bowel sounds are normal. He exhibits distension. There is no tenderness.  Genitourinary:  Enlarged prostate  Boggy and tender  Musculoskeletal: He exhibits edema and tenderness.  Neurological: He is alert and oriented to person, place, and time.  Psychiatric: He has a normal mood and affect. His behavior is normal.            Assessment & Plan:  Patient presents for yearly preventative medicine examination. Medicare questionnaire was completed  All immunizations and health maintenance protocols were reviewed with the patient and needed orders were placed.  Appropriate screening laboratory values were ordered for the patient including screening of hyperlipidemia, renal function and hepatic function. If indicated by BPH, a PSA was ordered.  Medication reconciliation,  past medical history, social history, problem list and allergies were reviewed in detail with the patient  Goals were established with regard to weight loss, exercise, and  diet in compliance with medications  End of life planning was discussed.  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 Neurosurgery? Pneumovax and TD Left hip injection for pain  Med for prostate doxycycline Ordered for screening labs placed   Informed consent obtained and the patient'sleft hip was prepped with betadine. Local anesthesia was obtained with topical spray. Then 40 mg of Depo-Medrol and 1/2 cc of lidocaine was injected into the joint space. The patient tolerated the procedure without complications. Post injection care discussed with patient.

## 2013-06-10 NOTE — Patient Instructions (Signed)
The patient is instructed to continue all medications as prescribed. Schedule followup with check out clerk upon leaving the clinic  

## 2013-06-10 NOTE — Progress Notes (Signed)
Pre visit review using our clinic review tool, if applicable. No additional management support is needed unless otherwise documented below in the visit note. 

## 2013-06-11 ENCOUNTER — Telehealth: Payer: Self-pay | Admitting: Internal Medicine

## 2013-06-11 NOTE — Telephone Encounter (Signed)
Relevant patient education mailed to patient.  

## 2013-06-17 NOTE — Telephone Encounter (Signed)
Pt needs referral to Dr. Nelva Bush (orthopedic) at [facility], [address] [place] [address], please fax completed silverback form to [facility] since there was some confusion on whether or not this referral could be done.

## 2013-06-19 ENCOUNTER — Telehealth: Payer: Self-pay | Admitting: Internal Medicine

## 2013-06-19 DIAGNOSIS — M542 Cervicalgia: Secondary | ICD-10-CM

## 2013-06-19 NOTE — Telephone Encounter (Signed)
Pt states dr Arnoldo Morale was to put in referral for [facility] for neck issues. Pt has appt on April 29 at 1pm. Dr Nelva Bush. Pt does not want to go to  [facility].

## 2013-07-05 ENCOUNTER — Other Ambulatory Visit: Payer: Self-pay | Admitting: Internal Medicine

## 2013-07-19 ENCOUNTER — Encounter: Payer: Self-pay | Admitting: Family Medicine

## 2013-07-19 ENCOUNTER — Ambulatory Visit (INDEPENDENT_AMBULATORY_CARE_PROVIDER_SITE_OTHER): Payer: Commercial Managed Care - HMO | Admitting: Family Medicine

## 2013-07-19 VITALS — BP 132/80 | HR 62 | Temp 97.9°F | Ht 73.0 in | Wt 261.5 lb

## 2013-07-19 DIAGNOSIS — M5380 Other specified dorsopathies, site unspecified: Secondary | ICD-10-CM

## 2013-07-19 DIAGNOSIS — T148XXA Other injury of unspecified body region, initial encounter: Secondary | ICD-10-CM

## 2013-07-19 DIAGNOSIS — M62830 Muscle spasm of back: Secondary | ICD-10-CM

## 2013-07-19 MED ORDER — CYCLOBENZAPRINE HCL 10 MG PO TABS: 10.0000 mg | ORAL_TABLET | Freq: Two times a day (BID) | ORAL | Status: DC | PRN

## 2013-07-19 NOTE — Progress Notes (Signed)
Pre visit review using our clinic review tool, if applicable. No additional management support is needed unless otherwise documented below in the visit note. 

## 2013-07-19 NOTE — Patient Instructions (Addendum)
-  heat for 15 minutes twice daily  -tylenol 500-1000mg  up to 3 times daily IF NEEDED for pain  -exercises provided as tolerated 4 days per week  -flexeril if needed and DO NOT use with other muscle relaxers such as the Robaxin (methocarbomal) you were given in the past and take only as needed and as instructed  -follow up with you back doctor in 2-4 weeks or if worsening or other concerns

## 2013-07-19 NOTE — Progress Notes (Signed)
No chief complaint on file.   HPI:  Acute visit for back pain: -pcp, dr. Arnoldo Morale, unavailable -on [facility] chronic issues with neck, back and hip pain, DDD, lumbar stenosis - referred to ortho remotely and in last few months by PCP for this, hx inj with ortho, has had mri - most recently of neck 2 months ago -reports: thinks he pulled muscle in mid R back; cant think of inciting event - maybe related to pulling lawnmower starter, worse with certain twisting movements, reports given methocarb in past and helped a little - but he wants flexeril, tylenol helps too -denies: fevers, vomiting, urinary or bowel changes, weakness or numbness  ROS: See pertinent positives and negatives per HPI.  Past Medical History  Diagnosis Date  . Hyperlipidemia   . Hypertension   . BPH (benign prostatic hyperplasia)   . DJD (degenerative joint disease)   . Lumbar stenosis   . Tubular adenoma of colon 07/2005  . Diverticulosis   . Hemorrhoids   . CAD (coronary artery disease) 10/04/10    s/p PCI of the Ramus, nonobstructive disease of LAD, LCx, and RCA  . IBS (irritable bowel syndrome)   . Insomnia     Past Surgical History  Procedure Laterality Date  . Coronary angioplasty with stent placement  10/04/10    PCI Ramus with Promus DES    Family History  Problem Relation Age of Onset  . Hypertension    . Colon polyps    . Colon cancer Paternal Grandfather   . Stroke Mother   . Lung cancer Father     History   Social History  . Marital Status: Widowed    Spouse Name: N/A    Number of Children: 2  . Years of Education: N/A   Occupational History  . Retired     Social History Main Topics  . Smoking status: Former Smoker    Quit date: 09/30/1975  . Smokeless tobacco: Never Used     Comment: quit in 1977 smoked 1.5 -2 ppd  . Alcohol Use: No  . Drug Use: No  . Sexual Activity: Yes   Other Topics Concern  . None   Social History Narrative   Lives in [place] with son.  Widowed.   Pt is  retired from a [employer].    Current outpatient prescriptions:alclomethasone (ACLOVATE) 0.05 % cream, APPLY  CREAM TWICE DAILY, Disp: 45 g, Rfl: 6;  aspirin 81 MG chewable tablet, Chew 81 mg by mouth daily.  , Disp: , Rfl: ;  BYSTOLIC 10 MG tablet, TAKE ONE TABLET BY MOUTH ONCE DAILY, Disp: 30 tablet, Rfl: 6;  diclofenac (VOLTAREN) 75 MG EC tablet, Take 1 tablet (75 mg total) by mouth 2 (two) times daily., Disp: 60 tablet, Rfl: 2 finasteride (PROSCAR) 5 MG tablet, Take 1 tablet (5 mg total) by mouth daily., Disp: 90 tablet, Rfl: 3;  losartan (COZAAR) 100 MG tablet, TAKE ONE TABLET BY MOUTH EVERY DAY, Disp: 90 tablet, Rfl: 3;  nitroGLYCERIN (NITROSTAT) 0.4 MG SL tablet, Place 0.4 mg under the tongue every 5 (five) minutes as needed., Disp: , Rfl: ;  polyethylene glycol powder (GLYCOLAX/MIRALAX) powder, Take 17 g by mouth as needed., Disp: , Rfl:  pravastatin (PRAVACHOL) 40 MG tablet, Take 1 tablet (40 mg total) by mouth daily., Disp: 90 tablet, Rfl: 3;  promethazine (PHENERGAN) 25 MG tablet, Take 1 tablet (25 mg total) by mouth every 6 (six) hours as needed for nausea., Disp: 60 tablet, Rfl: 2;  VOLTAREN 1 %  GEL, APPLY 2 GRAMS TOPICALLY FOUR TIMES DAILY., Disp: 200 g, Rfl: 0 cyclobenzaprine (FLEXERIL) 10 MG tablet, Take 1 tablet (10 mg total) by mouth 2 (two) times daily as needed for muscle spasms (do not use with other muscle relaxers such as methocarbomol)., Disp: 10 tablet, Rfl: 0  EXAM:  Filed Vitals:   07/19/13 1110  BP: 132/80  Pulse: 62  Temp: 97.9 F (36.6 C)    Body mass index is 34.51 kg/(m^2).  GENERAL: vitals reviewed and listed above, alert, oriented, appears well hydrated and in no acute distress  HEENT: atraumatic, conjunttiva clear, no obvious abnormalities on inspection of external nose and ears  NECK: no obvious masses on inspection  LUNGS: clear to auscultation bilaterally, no wheezes, rales or rhonchi, good air movement  CV: HRRR, no peripheral edema  MS: moves  all extremities without noticeable abnormality Normal Gait Normal inspection of back, no obvious scoliosis or leg length descrepancy No bony TTP Soft tissue TTP at: R LD muscle -/+ tests: ,-facet loading Normal muscle strength, sensation to light touch in LEs  PSYCH: pleasant and cooperative, no obvious depression or anxiety  ASSESSMENT AND PLAN:  Discussed the following assessment and plan:  Back spasm - Plan: cyclobenzaprine (FLEXERIL) 10 MG tablet  Muscle strain - Plan: cyclobenzaprine (FLEXERIL) 10 MG tablet  -we discussed possible serious and likely etiologies, workup and treatment, treatment risks and return precautions -after this discussion, Wesley Johnson opted for conservative tx per recs and orders and follow up with his back doctor  -of course, we advised Maxwell  to return or notify a doctor immediately if symptoms worsen or persist or new concerns arise.  -Patient advised to return or notify a doctor immediately if symptoms worsen or persist or new concerns arise.  Patient Instructions  -heat for 15 minutes twice daily  -tylenol 500-1000mg  up to 3 times daily IF NEEDED for pain  -exercises provided as tolerated 4 days per week  -flexeril if needed and DO NOT use with other muscle relaxers such as the Robaxin (methocarbomal) you were given in the past and take only as needed and as instructed  -follow up with you back doctor in 2-4 weeks or if worsening or other concerns     Lucretia Kern

## 2013-08-07 ENCOUNTER — Other Ambulatory Visit: Payer: Self-pay | Admitting: Internal Medicine

## 2013-08-26 ENCOUNTER — Telehealth: Payer: Self-pay | Admitting: Internal Medicine

## 2013-08-26 MED ORDER — ALCLOMETASONE DIPROPIONATE 0.05 % EX CREA: TOPICAL_CREAM | CUTANEOUS | Status: DC

## 2013-08-26 NOTE — Telephone Encounter (Signed)
[facility] [place], [place] - [address] Is requesting re-fill on alclomethasone (ACLOVATE) 0.05 % cream

## 2013-08-26 NOTE — Telephone Encounter (Signed)
rx sent in electronically 

## 2013-08-28 ENCOUNTER — Telehealth: Payer: Self-pay | Admitting: Internal Medicine

## 2013-08-28 NOTE — Telephone Encounter (Signed)
Pt needs refill on bystolic 10 mg #36 with refills call into [facility]

## 2013-08-29 ENCOUNTER — Telehealth: Payer: Self-pay | Admitting: Internal Medicine

## 2013-08-29 MED ORDER — NEBIVOLOL HCL 10 MG PO TABS: ORAL_TABLET | ORAL | Status: DC

## 2013-08-29 NOTE — Telephone Encounter (Signed)
Pt had a sample of dymista needs new rx call into [facility]

## 2013-08-29 NOTE — Telephone Encounter (Signed)
Rx sent to pharmacy   

## 2013-09-03 MED ORDER — AZELASTINE-FLUTICASONE 137-50 MCG/ACT NA SUSP: 1.0000 | Freq: Every day | NASAL | Status: DC

## 2013-09-03 NOTE — Telephone Encounter (Signed)
rx sent in electronically 

## 2013-09-04 ENCOUNTER — Telehealth: Payer: Self-pay | Admitting: Internal Medicine

## 2013-09-04 NOTE — Telephone Encounter (Signed)
Pt needs referral to his eye dr, dr Katy Fitch for his appointment 09/12/13. Pt been seeing this dr for years

## 2013-09-04 NOTE — Telephone Encounter (Signed)
This was done yesterday.  

## 2013-09-04 NOTE — Telephone Encounter (Signed)
Appt scheduled for 09-12-2013@9 :30 done  [id number]  Authorization [employer] start  09/12/2013 - end 03/15/2014 for  [facility]: Deliah Goody MD  Address: [address], [place], [place] [address]  [phone]

## 2013-09-04 NOTE — Telephone Encounter (Signed)
Pt states he was given samples of DYMISTA nasal spray by dr Arnoldo Morale.  Pt states this works and would like a refill of [facility] [address] Pt is on the list for [facility], but is undecided  . Will schedule w/ matt if he decides to stay for any future refills before he can get appt w/ [facility]

## 2013-09-12 ENCOUNTER — Other Ambulatory Visit: Payer: Self-pay

## 2013-09-12 ENCOUNTER — Telehealth: Payer: Self-pay | Admitting: Internal Medicine

## 2013-09-12 DIAGNOSIS — R200 Anesthesia of skin: Secondary | ICD-10-CM

## 2013-09-12 DIAGNOSIS — R202 Paresthesia of skin: Principal | ICD-10-CM

## 2013-09-12 NOTE — Telephone Encounter (Signed)
Pt insurance requires him to have a referral to see his heart doctor  Dr Rayann Heman

## 2013-09-12 NOTE — Telephone Encounter (Signed)
Pt states he has to see cardiology for the numbness and tingling in his left arm and because he has [employer] he needs a new referral.  Referral placed

## 2013-09-19 ENCOUNTER — Telehealth: Payer: Self-pay | Admitting: Internal Medicine

## 2013-09-19 NOTE — Telephone Encounter (Signed)
Advised pt to call 911 for ER visit to be evaluated. Pt states that he will not and if he decides to go to the ER then he will drive or have some one to drive him there. He states that he thinks it is just a muscle pain because he went on a mile and a half walk yesterday and experienced no SOB or CP. He says that if he will go to the ER if the pain gets worse. Pain has been present for 1 week, per pt. Pt aware of the risks of not reporting to the ER immediately and verbalized understanding.

## 2013-09-19 NOTE — Telephone Encounter (Signed)
Patient Information:  Caller Name: Antoni  Phone: [phone]  Patient: Wesley Johnson, Wesley Johnson  Gender: Male  DOB: April 22, 1940  Age: 73 Years  PCP: Benay Pillow (Adults only, leaving end of July 2015)  Office Follow Up:  Does the office need to follow up with this patient?: Yes  Instructions For The Office: Pt uncertian if will seek care for the numbness present now in left arm and left leg.  RN Note:  Also noted left leg "feels kind of numb" for past week. Reluctant to call 911 becase sympotms rpesent for past week and went for a 1.5 mile walk 09/18/13.  Explaied withiut immediate treatment, he could suffer from a full CVA or death.  Verbalized understanding but remained reluctant to call 911.  RN asked if he's known anyone who had a disabling CVA. He replied his mother had a CVA.  Again advised to call 911 for immediate evaluation/treatment.  Symptoms  Reason For Call & Symptoms: Persistent left arm numbness; "like it is asleep or heavy."  Arm feels weak. Able to raise arm above head and grip items in hand.  Reviewed Health History In EMR: Yes  Reviewed Medications In EMR: Yes  Reviewed Allergies In EMR: Yes  Reviewed Surgeries / Procedures: Yes  Date of Onset of Symptoms: 09/12/2013  Treatments Tried: Took Tylenol  Treatments Tried Worked: No  Guideline(s) Used:  Neurologic Deficit  Disposition Per Guideline:   Call EMS 911 Now  Reason For Disposition Reached:   New neurologic deficit that is present NOW, sudden onset of ANY of the following:   Weakness of the face, arm, or leg on one side of the body  Numbness of the face, arm, or leg on one side of the body  Loss of speech or garbled speech  Advice Given:  N/A  Patient Refused Recommendation:  Patient Refused Care Advice  RN uncertian what action he will take.

## 2013-09-19 NOTE — Telephone Encounter (Signed)
Error/gd °

## 2013-09-20 ENCOUNTER — Telehealth: Payer: Self-pay | Admitting: Internal Medicine

## 2013-09-20 NOTE — Telephone Encounter (Signed)
Will offer 09/23/13 with Dr Mare Ferrari at 10:15  DOD per Dr Arnoldo Morale

## 2013-09-20 NOTE — Telephone Encounter (Signed)
New message         Pt is experiencing numbness in left arm for about a week / pt would like to be seen sooner or come in for an ekg

## 2013-09-23 ENCOUNTER — Encounter: Payer: Self-pay | Admitting: Cardiology

## 2013-09-23 ENCOUNTER — Ambulatory Visit (INDEPENDENT_AMBULATORY_CARE_PROVIDER_SITE_OTHER): Payer: Commercial Managed Care - HMO | Admitting: Cardiology

## 2013-09-23 VITALS — BP 154/79 | HR 62 | Ht 73.0 in | Wt 258.0 lb

## 2013-09-23 DIAGNOSIS — R202 Paresthesia of skin: Principal | ICD-10-CM

## 2013-09-23 DIAGNOSIS — I259 Chronic ischemic heart disease, unspecified: Secondary | ICD-10-CM

## 2013-09-23 DIAGNOSIS — R200 Anesthesia of skin: Secondary | ICD-10-CM

## 2013-09-23 DIAGNOSIS — I10 Essential (primary) hypertension: Secondary | ICD-10-CM

## 2013-09-23 DIAGNOSIS — R209 Unspecified disturbances of skin sensation: Secondary | ICD-10-CM

## 2013-09-23 NOTE — Patient Instructions (Signed)
Your physician recommends that you continue on your current medications as directed. Please refer to the Current Medication list given to you today.  Your physician recommends that you schedule a follow-up appointment in: WITH DR ALLRED IN 3-4 MONTHS  Follow up with your PCP regarding neck pain/pinched nerve in neck

## 2013-09-23 NOTE — Progress Notes (Signed)
Wesley Johnson Date of Birth:  1940-08-03 [place] [address] [place], [place]  [address] [phone]        Fax   [phone]   History of Present Illness: This 73 year old gentleman is seen as a DOD office visit.  He is a cardiac patient of Dr. Rayann Heman in a medical patient of Dr. Benay Pillow.  He has a history of known ischemic heart disease.  He had a cardiac catheterization and PCI on 10/04/10 at which time he underwent PCI of the ramus and was found to have nonobstructive disease of the LAD circumflex and right coronary artery.  He has a drug-eluting stent.  He has not been experiencing any recurrent angina pectoris.  He had called the office on 09/19/13 complaining of numbness in his left arm.  His left arm is also felt heavy.  He has had pain in the left side of his neck.  The pain is worse if he twists his neck or head.  He had a recent injection in the area by Dr. Nelva Bush which did not help the pain.  He has been told that he has bone spurs in his neck.  His left arm symptoms are not worse with exercise.  He walks a mile and a half twice a week with his son with no difficulty.  He has not been having any increased shortness of breath.  When his left arm is bothering him he also notes slight discomfort in the interscapular area and in the left upper pectoral area.  These symptoms do not appear to be related to exertion however.  There are more positional.  Current Outpatient Prescriptions  Medication Sig Dispense Refill  . alclomethasone (ACLOVATE) 0.05 % cream APPLY  CREAM TOPICALLY TWICE DAILY  45 g  0  . aspirin 81 MG chewable tablet Chew 81 mg by mouth daily.        . Azelastine-Fluticasone 137-50 MCG/ACT SUSP Place 1 spray into the nose daily.  23 g  5  . cyclobenzaprine (FLEXERIL) 10 MG tablet Take 1 tablet (10 mg total) by mouth 2 (two) times daily as needed for muscle spasms (do not use with other muscle relaxers such as methocarbomol).  10 tablet  0  .  diclofenac (VOLTAREN) 75 MG EC tablet Take 1 tablet (75 mg total) by mouth 2 (two) times daily.  60 tablet  2  . finasteride (PROSCAR) 5 MG tablet Take 1 tablet (5 mg total) by mouth daily.  90 tablet  3  . losartan (COZAAR) 100 MG tablet TAKE ONE TABLET BY MOUTH EVERY DAY  90 tablet  3  . nebivolol (BYSTOLIC) 10 MG tablet TAKE ONE TABLET BY MOUTH ONCE DAILY  30 tablet  2  . nitroGLYCERIN (NITROSTAT) 0.4 MG SL tablet Place 0.4 mg under the tongue every 5 (five) minutes as needed.      . polyethylene glycol powder (GLYCOLAX/MIRALAX) powder Take 17 g by mouth as needed.      . pravastatin (PRAVACHOL) 40 MG tablet Take 1 tablet (40 mg total) by mouth daily.  90 tablet  3  . promethazine (PHENERGAN) 25 MG tablet Take 1 tablet (25 mg total) by mouth every 6 (six) hours as needed for nausea.  60 tablet  2  . VOLTAREN 1 % GEL APPLY 2 GRAMS TOPICALLY FOUR TIMES DAILY.  200 g  0   No current facility-administered medications for this visit.    Allergies  Allergen Reactions  .  Ofloxacin     N&v.    Patient Active Problem List   Diagnosis Date Noted  . Numbness and tingling in left arm 05/01/2013  . Chronic cough 06/20/2010  . OTHER SEBORRHEIC DERMATITIS 11/12/2009  . OSTEOARTHRITIS 08/11/2009  . ALLERGIC RHINITIS DUE TO OTHER ALLERGEN 07/08/2009  . NECK PAIN, LEFT 07/08/2009  . IRRITABLE BOWEL SYNDROME 02/04/2009  . PERSONAL HX COLONIC POLYPS 01/07/2009  . LEG CRAMPS 03/03/2008  . CAD (coronary artery disease) 02/25/2008  . ABDOMINAL PAIN, LEFT LOWER QUADRANT 02/25/2008  . BILIOUS EMESIS 01/17/2008  . DYSPHAGIA 01/17/2008  . FUNGAL DERMATITIS 12/06/2007  . ABDOMINAL TENDERNESS, LEFT LOWER QUADRANT 12/06/2007  . NAUSEA 11/22/2007  . INSOMNIA 10/24/2007  . ARTHRITIS, RIGHT SHOULDER 05/22/2007  . DEGENERATIVE DISC DISEASE, CERVICAL SPINE 05/22/2007  . Dizziness and Giddiness 11/28/2006  . LOW BACK PAIN 11/16/2006  . BENIGN PROSTATIC HYPERTROPHY 09/18/2006  . PROSTATITIS, CHRONIC  09/18/2006  . HYPERLIPIDEMIA 08/23/2006  . HYPERTENSION 08/23/2006  . COLONIC POLYPS 08/04/2005  . HEMORRHOIDS, INTERNAL 08/04/2005  . DIVERTICULOSIS, COLON 08/04/2005    History  Smoking status  . Former Smoker  . Quit date: 09/30/1975  Smokeless tobacco  . Never Used    Comment: quit in 1977 smoked 1.5 -2 ppd    History  Alcohol Use No    Family History  Problem Relation Age of Onset  . Hypertension    . Colon polyps    . Colon cancer Paternal Grandfather   . Stroke Mother   . Lung cancer Father     Review of Systems: Constitutional: no fever chills diaphoresis or fatigue or change in weight.  Head and neck: no hearing loss, no epistaxis, no photophobia or visual disturbance.  The patient has been told of recent increased intraocular pressure and has an appointment pending with his ophthalmologist Respiratory: No cough, shortness of breath or wheezing. Cardiovascular: No chest pain peripheral edema, palpitations. Gastrointestinal: No abdominal distention, no abdominal pain, no change in bowel habits hematochezia or melena. Genitourinary: No dysuria, no frequency, no urgency, no nocturia. Musculoskeletal:No arthralgias, no back pain, no gait disturbance or myalgias. Neurological: No dizziness, no headaches, no numbness, no seizures, no syncope, no weakness, no tremors. Hematologic: No lymphadenopathy, no easy bruising. Psychiatric: No confusion, no hallucinations, no sleep disturbance.    Physical Exam: Filed Vitals:   09/23/13 1018  BP: 154/79  Pulse: 62   the general appearance reveals a well-developed well-nourished large gentleman in no distress.The head and neck exam reveals pupils equal and reactive.  Extraocular movements are full.  There is no scleral icterus.  The mouth and pharynx are normal.  The neck is supple.  The carotids reveal no bruits.  The jugular venous pressure is normal.  The  thyroid is not enlarged.  There is no lymphadenopathy.  The chest is  clear to percussion and auscultation.  There are no rales or rhonchi.  Expansion of the chest is symmetrical.  The precordium is quiet.  The first heart sound is normal.  The second heart sound is physiologically split.  There is no murmur gallop rub or click.  There is no abnormal lift or heave.  The abdomen is soft and nontender.  The bowel sounds are normal.  The liver and spleen are not enlarged.  There are no abdominal masses.  There are no abdominal bruits.  Extremities reveal good pedal pulses.  There is no phlebitis or edema.  There is no cyanosis or clubbing.  Strength is normal and symmetrical in  all extremities.  There is no lateralizing weakness.  There are no sensory deficits.  The skin is warm and dry.  There is no rash.  EKG shows sinus bradycardia and poor R-wave progression V1 through V2.  Since last tracing of 12/24/13, PVCs have resolved.  Assessment / Plan: 1.  Ischemic heart disease status post drug-eluting stent to ramus in August 2012 2. left neck pain and left arm numbness probably secondary to cervical radiculitis 3. Hypercholesterolemia 4. essential hypertension without heart failure  Plan: I feel that his left arm pain is not on a vascular basis.  I suspect it is related to cervical radiculitis.  He will talk with his PCP about possible neurosurgical referral and further workup. I suggested updating his ischemic workup with a Lexi scan Myoview.  However the patient declines proceeding with that test at this time.  The patient is to continue his current medications.  Continue walking a mile and a half several days a week. Followup with Dr. Rayann Heman in 3-4 months as scheduled. Medical followup with Dr. Benay Pillow to discuss left arm numbness further and consider neurosurgery referral

## 2013-10-21 ENCOUNTER — Ambulatory Visit: Payer: Commercial Managed Care - HMO | Admitting: Internal Medicine

## 2013-10-25 ENCOUNTER — Ambulatory Visit: Payer: Commercial Managed Care - HMO | Admitting: Cardiology

## 2014-01-13 ENCOUNTER — Ambulatory Visit (INDEPENDENT_AMBULATORY_CARE_PROVIDER_SITE_OTHER): Payer: Commercial Managed Care - HMO | Admitting: Internal Medicine

## 2014-01-13 ENCOUNTER — Encounter: Payer: Self-pay | Admitting: Internal Medicine

## 2014-01-13 VITALS — BP 142/84 | HR 61 | Ht 73.0 in | Wt 260.4 lb

## 2014-01-13 DIAGNOSIS — I2510 Atherosclerotic heart disease of native coronary artery without angina pectoris: Secondary | ICD-10-CM

## 2014-01-13 DIAGNOSIS — I10 Essential (primary) hypertension: Secondary | ICD-10-CM

## 2014-01-13 DIAGNOSIS — I259 Chronic ischemic heart disease, unspecified: Secondary | ICD-10-CM

## 2014-01-13 MED ORDER — NITROGLYCERIN 0.4 MG SL SUBL: 0.4000 mg | SUBLINGUAL_TABLET | SUBLINGUAL | Status: DC | PRN

## 2014-01-13 NOTE — Progress Notes (Signed)
PCP: Marton Redwood, MD  Wesley Johnson is a 73 y.o. male who presents today for routine cardiology followup.  Since last being seen in our clinic, the patient reports doing very well.  Unfortunately, he has not been as diligent with his exercise routine as previous.  He recently presented with atypical arm/ neck pain which was felt to be radicular. Today, he denies symptoms of palpitations, chest pain, shortness of breath,  lower extremity edema, dizziness, presyncope, or syncope.    The patient is otherwise without complaint today.   Past Medical History  Diagnosis Date  . Hyperlipidemia   . Hypertension   . BPH (benign prostatic hyperplasia)   . DJD (degenerative joint disease)   . Lumbar stenosis   . Tubular adenoma of colon 07/2005  . Diverticulosis   . Hemorrhoids   . CAD (coronary artery disease) 10/04/10    s/p PCI of the Ramus, nonobstructive disease of LAD, LCx, and RCA  . IBS (irritable bowel syndrome)   . Insomnia    Past Surgical History  Procedure Laterality Date  . Coronary angioplasty with stent placement  10/04/10    PCI Ramus with Promus DES    Current Outpatient Prescriptions  Medication Sig Dispense Refill  . alclomethasone (ACLOVATE) 0.05 % cream APPLY  CREAM TOPICALLY TWICE DAILY 45 g 0  . aspirin 81 MG chewable tablet Chew 81 mg by mouth daily.      . Azelastine-Fluticasone 137-50 MCG/ACT SUSP Place 1 spray into the nose daily. 23 g 5  . cyclobenzaprine (FLEXERIL) 10 MG tablet Take 1 tablet (10 mg total) by mouth 2 (two) times daily as needed for muscle spasms (do not use with other muscle relaxers such as methocarbomol). 10 tablet 0  . diclofenac (VOLTAREN) 75 MG EC tablet Take 1 tablet (75 mg total) by mouth 2 (two) times daily. 60 tablet 2  . finasteride (PROSCAR) 5 MG tablet Take 1 tablet (5 mg total) by mouth daily. 90 tablet 3  . losartan (COZAAR) 100 MG tablet TAKE ONE TABLET BY MOUTH EVERY DAY 90 tablet 3  . nebivolol (BYSTOLIC) 10 MG tablet TAKE ONE  TABLET BY MOUTH ONCE DAILY 30 tablet 2  . nitroGLYCERIN (NITROSTAT) 0.4 MG SL tablet Place 1 tablet (0.4 mg total) under the tongue every 5 (five) minutes as needed for chest pain (MAX 3 TABLETS). 25 tablet 3  . polyethylene glycol powder (GLYCOLAX/MIRALAX) powder Take 17 g by mouth daily as needed for mild constipation.     . pravastatin (PRAVACHOL) 40 MG tablet Take 1 tablet (40 mg total) by mouth daily. 90 tablet 3  . promethazine (PHENERGAN) 25 MG tablet Take 1 tablet (25 mg total) by mouth every 6 (six) hours as needed for nausea. 60 tablet 2   No current facility-administered medications for this visit.    Physical Exam: Filed Vitals:   01/13/14 1118  BP: 142/84  Pulse: 61  Height: 6\' 1"  (1.854 m)  Weight: 260 lb 6.4 oz (118.117 kg)    GEN- The patient is well appearing, alert and oriented x 3 today.   Head- normocephalic, atraumatic Eyes-  Sclera clear, conjunctiva pink Ears- hearing intact Oropharynx- clear Lungs- Clear to ausculation bilaterally, normal work of breathing Heart- Regular rate and rhythm, no murmurs, rubs or gallops, PMI not laterally displaced GI- soft, NT, ND, + BS Extremities- no clubbing, cyanosis, or edema  ekg today reveals sinus rhythm 61 bpm normal ekg  Assessment and Plan:  1. CAD Stable No change required  today  2. HL Stable No change required today  3. HTN Stable No change required today  4. Obesity Weight loss and regular exercise are encouraged  Return in a year

## 2014-01-13 NOTE — Patient Instructions (Signed)
Your physician wants you to follow-up in: 12 months with Dr Allred You will receive a reminder letter in the mail two months in advance. If you don't receive a letter, please call our office to schedule the follow-up appointment.  

## 2014-06-30 DIAGNOSIS — E785 Hyperlipidemia, unspecified: Secondary | ICD-10-CM | POA: Diagnosis not present

## 2014-06-30 DIAGNOSIS — I10 Essential (primary) hypertension: Secondary | ICD-10-CM | POA: Diagnosis not present

## 2014-06-30 DIAGNOSIS — Z0000 Encounter for general adult medical examination without abnormal findings: Secondary | ICD-10-CM | POA: Diagnosis not present

## 2014-06-30 DIAGNOSIS — Z125 Encounter for screening for malignant neoplasm of prostate: Secondary | ICD-10-CM | POA: Diagnosis not present

## 2014-07-03 DIAGNOSIS — H2513 Age-related nuclear cataract, bilateral: Secondary | ICD-10-CM | POA: Diagnosis not present

## 2014-07-03 DIAGNOSIS — Z1212 Encounter for screening for malignant neoplasm of rectum: Secondary | ICD-10-CM | POA: Diagnosis not present

## 2014-07-03 DIAGNOSIS — H40053 Ocular hypertension, bilateral: Secondary | ICD-10-CM | POA: Diagnosis not present

## 2014-07-07 ENCOUNTER — Encounter: Payer: Self-pay | Admitting: Gastroenterology

## 2014-07-07 DIAGNOSIS — I10 Essential (primary) hypertension: Secondary | ICD-10-CM | POA: Diagnosis not present

## 2014-07-07 DIAGNOSIS — N401 Enlarged prostate with lower urinary tract symptoms: Secondary | ICD-10-CM | POA: Diagnosis not present

## 2014-07-07 DIAGNOSIS — Z0000 Encounter for general adult medical examination without abnormal findings: Secondary | ICD-10-CM | POA: Diagnosis not present

## 2014-07-07 DIAGNOSIS — H40059 Ocular hypertension, unspecified eye: Secondary | ICD-10-CM | POA: Diagnosis not present

## 2014-07-07 DIAGNOSIS — E8881 Metabolic syndrome: Secondary | ICD-10-CM | POA: Diagnosis not present

## 2014-07-07 DIAGNOSIS — Z9861 Coronary angioplasty status: Secondary | ICD-10-CM | POA: Diagnosis not present

## 2014-07-07 DIAGNOSIS — R7301 Impaired fasting glucose: Secondary | ICD-10-CM | POA: Diagnosis not present

## 2014-07-07 DIAGNOSIS — E785 Hyperlipidemia, unspecified: Secondary | ICD-10-CM | POA: Diagnosis not present

## 2014-07-15 DIAGNOSIS — H40053 Ocular hypertension, bilateral: Secondary | ICD-10-CM | POA: Diagnosis not present

## 2014-07-29 DIAGNOSIS — H04123 Dry eye syndrome of bilateral lacrimal glands: Secondary | ICD-10-CM | POA: Diagnosis not present

## 2014-07-29 DIAGNOSIS — H40053 Ocular hypertension, bilateral: Secondary | ICD-10-CM | POA: Diagnosis not present

## 2014-08-29 ENCOUNTER — Ambulatory Visit (AMBULATORY_SURGERY_CENTER): Payer: Self-pay | Admitting: *Deleted

## 2014-08-29 VITALS — Ht 73.0 in | Wt 260.8 lb

## 2014-08-29 DIAGNOSIS — Z86010 Personal history of colonic polyps: Secondary | ICD-10-CM

## 2014-08-29 MED ORDER — NA SULFATE-K SULFATE-MG SULF 17.5-3.13-1.6 GM/177ML PO SOLN: ORAL | Status: DC

## 2014-08-29 NOTE — Progress Notes (Signed)
No allergies to eggs or soy. No problems with anesthesia.  Pt not given Emmi instructions for colonoscopy; no computer access  No oxygen use  No diet drug use  

## 2014-09-12 ENCOUNTER — Ambulatory Visit (AMBULATORY_SURGERY_CENTER): Payer: Medicare HMO | Admitting: Gastroenterology

## 2014-09-12 ENCOUNTER — Encounter: Payer: Self-pay | Admitting: Gastroenterology

## 2014-09-12 VITALS — BP 137/66 | HR 59 | Temp 97.5°F | Resp 20 | Ht 72.0 in | Wt 260.0 lb

## 2014-09-12 DIAGNOSIS — Z86010 Personal history of colonic polyps: Secondary | ICD-10-CM

## 2014-09-12 DIAGNOSIS — D122 Benign neoplasm of ascending colon: Secondary | ICD-10-CM

## 2014-09-12 DIAGNOSIS — M545 Low back pain: Secondary | ICD-10-CM | POA: Diagnosis not present

## 2014-09-12 DIAGNOSIS — N400 Enlarged prostate without lower urinary tract symptoms: Secondary | ICD-10-CM | POA: Diagnosis not present

## 2014-09-12 DIAGNOSIS — I2510 Atherosclerotic heart disease of native coronary artery without angina pectoris: Secondary | ICD-10-CM | POA: Diagnosis not present

## 2014-09-12 DIAGNOSIS — D120 Benign neoplasm of cecum: Secondary | ICD-10-CM | POA: Diagnosis not present

## 2014-09-12 DIAGNOSIS — I10 Essential (primary) hypertension: Secondary | ICD-10-CM | POA: Diagnosis not present

## 2014-09-12 MED ORDER — SODIUM CHLORIDE 0.9 % IV SOLN: 500.0000 mL | INTRAVENOUS | Status: DC

## 2014-09-12 NOTE — Progress Notes (Signed)
Called to room to assist during endoscopic procedure.  Patient ID and intended procedure confirmed with present staff. Received instructions for my participation in the procedure from the performing physician.  

## 2014-09-12 NOTE — Progress Notes (Signed)
Report to PACU, RN, vss, BBS= Clear.  

## 2014-09-12 NOTE — Op Note (Signed)
[address]  [address] [place], [id number]   COLONOSCOPY PROCEDURE REPORT  PATIENT: Wesley Johnson, Wesley Johnson  MR#: [id number] BIRTHDATE: 18-Jul-1940 , 73  yrs. old GENDER: male ENDOSCOPIST: Ladene Artist, MD, [facility] REFERRED BY:W.  Lutricia Feil, M.D. PROCEDURE DATE:  09/12/2014 PROCEDURE:   Colonoscopy, surveillance and Colonoscopy with snare polypectomy First Screening Colonoscopy - Avg.  risk and is 50 yrs.  old or older - No.  Prior Negative Screening - Now for repeat screening. N/A  History of Adenoma - Now for follow-up colonoscopy & has been > or = to 3 yrs.  Yes hx of adenoma.  Has been 3 or more years since last colonoscopy.  Polyps removed today? Yes ASA CLASS:   Class III INDICATIONS:Surveillance due to prior colonic neoplasia and PH Colon Adenoma. MEDICATIONS: Monitored anesthesia care and Propofol 100 mg IV DESCRIPTION OF PROCEDURE:   After the risks benefits and alternatives of the procedure were thoroughly explained, informed consent was obtained.  The digital rectal exam revealed no abnormalities of the rectum.   The LB [id number]  endoscope was introduced through the anus and advanced to the cecum, which was identified by both the appendix and ileocecal valve. No adverse events experienced.   The quality of the prep was excellent. (Suprep was used)  The instrument was then slowly withdrawn as the colon was fully examined. Estimated blood loss is zero unless otherwise noted in this procedure report.    COLON FINDINGS: Three sessile polyps measuring 5-7 mm in size were found in the ascending colon and at the cecum.  Polypectomies were performed with a cold snare.  The resection was complete, the polyp tissue was completely retrieved and sent to histology.   There was mild diverticulosis noted in the sigmoid colon.   The examination was otherwise normal.  Retroflexed views revealed internal Grade I hemorrhoids. The time to cecum = 1.6  Withdrawal time = 10.4   The scope was withdrawn and the procedure completed. COMPLICATIONS: There were no immediate complications.  ENDOSCOPIC IMPRESSION: 1.   Three sessile polyps in the ascending colon and at the cecum; polypectomies performed with a cold snare 2.   Mild diverticulosis in the sigmoid colon 3.   Grade l internal hemorrhoids  RECOMMENDATIONS: 1.  Await pathology results 2.  High fiber diet with liberal fluid intake. 3.  Repeat Colonoscopy in 5 years.  eSigned:  Ladene Artist, MD, [facility] 09/12/2014 10:25 AM

## 2014-09-12 NOTE — Patient Instructions (Signed)
YOU HAD AN ENDOSCOPIC PROCEDURE TODAY AT [facility]:   Refer to the procedure report that was given to you for any specific questions about what was found during the examination.  If the procedure report does not answer your questions, please call your gastroenterologist to clarify.  If you requested that your care partner not be given the details of your procedure findings, then the procedure report has been included in a sealed envelope for you to review at your convenience later.  YOU SHOULD EXPECT: Some feelings of bloating in the abdomen. Passage of more gas than usual.  Walking can help get rid of the air that was put into your GI tract during the procedure and reduce the bloating. If you had a lower endoscopy (such as a colonoscopy or flexible sigmoidoscopy) you may notice spotting of blood in your stool or on the toilet paper. If you underwent a bowel prep for your procedure, you may not have a normal bowel movement for a few days.  Please Note:  You might notice some irritation and congestion in your nose or some drainage.  This is from the oxygen used during your procedure.  There is no need for concern and it should clear up in a day or so.  SYMPTOMS TO REPORT IMMEDIATELY:   Following lower endoscopy (colonoscopy or flexible sigmoidoscopy):  Excessive amounts of blood in the stool  Significant tenderness or worsening of abdominal pains  Swelling of the abdomen that is new, acute  Fever of 100F or higher  For urgent or emergent issues, a gastroenterologist can be reached at any hour by calling [phone].   DIET: Your first meal following the procedure should be a small meal and then it is ok to progress to your normal diet. Heavy or fried foods are harder to digest and may make you feel nauseous or bloated.  Likewise, meals heavy in dairy and vegetables can increase bloating.  Drink plenty of fluids but you should avoid alcoholic beverages for 24  hours.  ACTIVITY:  You should plan to take it easy for the rest of today and you should NOT DRIVE or use heavy machinery until tomorrow (because of the sedation medicines used during the test).    FOLLOW UP: Our staff will call the number listed on your records the next business day following your procedure to check on you and address any questions or concerns that you may have regarding the information given to you following your procedure. If we do not reach you, we will leave a message.  However, if you are feeling well and you are not experiencing any problems, there is no need to return our call.  We will assume that you have returned to your regular daily activities without incident.  If any biopsies were taken you will be contacted by phone or by letter within the next 1-3 weeks.  Please call us at [phone] if you have not heard about the biopsies in 3 weeks.    SIGNATURES/CONFIDENTIALITY: You and/or your care partner have signed paperwork which will be entered into your electronic medical record.  These signatures attest to the fact that that the information above on your After Visit Summary has been reviewed and is understood.  Full responsibility of the confidentiality of this discharge information lies with you and/or your care-partner.  Polyps, diverticulosis, and high fiber diet information given. Liberal fluid intake.

## 2014-09-15 ENCOUNTER — Telehealth: Payer: Self-pay

## 2014-09-15 NOTE — Telephone Encounter (Signed)
I spoke with the pt's son, Wesley Johnson, this am.  He said Wesley Johnson, his father was not able to except our phone call.  I asked that he please give him the message that we called and if he has any problems or questions to please give [place] a phone call back.  He said he would. maw

## 2014-09-16 ENCOUNTER — Telehealth: Payer: Self-pay | Admitting: Gastroenterology

## 2014-09-16 NOTE — Telephone Encounter (Signed)
Patient reports that he is gassey and does not having normal BM's.  He is advised to begin Miralax 1 capful daily until he returns to his normal bowel habits.  He is advised to try gas-x for gas.  He denies fever, nausea, vomiting, abdominal pain, fever, rectal bleeding.  He will call back for any additional questions or concerns.

## 2014-09-18 ENCOUNTER — Encounter: Payer: Self-pay | Admitting: Gastroenterology

## 2014-11-28 DIAGNOSIS — H2513 Age-related nuclear cataract, bilateral: Secondary | ICD-10-CM | POA: Diagnosis not present

## 2014-11-28 DIAGNOSIS — H40053 Ocular hypertension, bilateral: Secondary | ICD-10-CM | POA: Diagnosis not present

## 2014-11-28 DIAGNOSIS — H04123 Dry eye syndrome of bilateral lacrimal glands: Secondary | ICD-10-CM | POA: Diagnosis not present

## 2015-01-20 DIAGNOSIS — Z23 Encounter for immunization: Secondary | ICD-10-CM | POA: Diagnosis not present

## 2015-01-28 ENCOUNTER — Ambulatory Visit (INDEPENDENT_AMBULATORY_CARE_PROVIDER_SITE_OTHER): Payer: Commercial Managed Care - HMO | Admitting: Internal Medicine

## 2015-01-28 ENCOUNTER — Encounter: Payer: Self-pay | Admitting: Internal Medicine

## 2015-01-28 VITALS — BP 134/76 | HR 67 | Ht 73.0 in | Wt 258.8 lb

## 2015-01-28 DIAGNOSIS — I10 Essential (primary) hypertension: Secondary | ICD-10-CM

## 2015-01-28 DIAGNOSIS — I2510 Atherosclerotic heart disease of native coronary artery without angina pectoris: Secondary | ICD-10-CM | POA: Diagnosis not present

## 2015-01-28 NOTE — Patient Instructions (Signed)
Medication Instructions:  Your physician recommends that you continue on your current medications as directed. Please refer to the Current Medication list given to you today.   Labwork: None ordered   Testing/Procedures: None ordered   Follow-Up: Your physician wants you to follow-up in: 12 months with Scott Weaver, PA You will receive a reminder letter in the mail two months in advance. If you don't receive a letter, please call our office to schedule the follow-up appointment.   Any Other Special Instructions Will Be Listed Below (If Applicable).     If you need a refill on your cardiac medications before your next appointment, please call your pharmacy.   

## 2015-01-28 NOTE — Progress Notes (Signed)
PCP: Marton Redwood, MD  Wesley Johnson is a 74 y.o. male who presents today for routine cardiology followup.  Since last being seen in our clinic, the patient reports doing very well.  Unfortunately, he has not been as diligent with his exercise routine as previous.    Today, he denies symptoms of palpitations, chest pain, shortness of breath,  lower extremity edema, dizziness, presyncope, or syncope.    The patient is otherwise without complaint today.   Past Medical History  Diagnosis Date  . Hyperlipidemia   . Hypertension   . BPH (benign prostatic hyperplasia)   . DJD (degenerative joint disease)   . Lumbar stenosis   . Tubular adenoma of colon 07/2005  . Diverticulosis   . Hemorrhoids   . CAD (coronary artery disease) 10/04/10    s/p PCI of the Ramus, nonobstructive disease of LAD, LCx, and RCA  . IBS (irritable bowel syndrome)   . Insomnia    Past Surgical History  Procedure Laterality Date  . Coronary angioplasty with stent placement  10/04/10    PCI Ramus with Promus DES    Current Outpatient Prescriptions  Medication Sig Dispense Refill  . alclomethasone (ACLOVATE) 0.05 % cream APPLY  CREAM TOPICALLY TWICE DAILY 45 g 0  . aspirin 81 MG chewable tablet Chew 81 mg by mouth daily.      . cyclobenzaprine (FLEXERIL) 10 MG tablet Take 1 tablet (10 mg total) by mouth 2 (two) times daily as needed for muscle spasms (do not use with other muscle relaxers such as methocarbomol). 10 tablet 0  . diclofenac sodium (VOLTAREN) 1 % GEL Apply 2 g topically 4 (four) times daily.    . finasteride (PROSCAR) 5 MG tablet Take 1 tablet (5 mg total) by mouth daily. 90 tablet 3  . losartan (COZAAR) 100 MG tablet TAKE ONE TABLET BY MOUTH EVERY DAY 90 tablet 3  . nebivolol (BYSTOLIC) 10 MG tablet TAKE ONE TABLET BY MOUTH ONCE DAILY 30 tablet 2  . nitroGLYCERIN (NITROSTAT) 0.4 MG SL tablet Place 1 tablet (0.4 mg total) under the tongue every 5 (five) minutes as needed for chest pain (MAX 3 TABLETS). 25  tablet 3  . polyethylene glycol powder (GLYCOLAX/MIRALAX) powder Take 17 g by mouth daily as needed for mild constipation.     . pravastatin (PRAVACHOL) 40 MG tablet Take 1 tablet (40 mg total) by mouth daily. 90 tablet 3  . promethazine (PHENERGAN) 25 MG tablet Take 1 tablet (25 mg total) by mouth every 6 (six) hours as needed for nausea. 60 tablet 2   No current facility-administered medications for this visit.    Physical Exam: Filed Vitals:   01/28/15 1436  BP: 134/76  Pulse: 67  Height: 6\' 1"  (1.854 m)  Weight: 258 lb 12.8 oz (117.391 kg)    GEN- The patient is well appearing, alert and oriented x 3 today.   Head- normocephalic, atraumatic Eyes-  Sclera clear, conjunctiva pink Ears- hearing intact Oropharynx- clear Lungs- Clear to ausculation bilaterally, normal work of breathing Heart- Regular rate and rhythm, no murmurs, rubs or gallops, PMI not laterally displaced GI- soft, NT, ND, + BS Extremities- no clubbing, cyanosis, or edema  ekg today reveals sinus rhythm   Assessment and Plan:  1. CAD Stable No change required today  2. HL Stable I have asked that he have lipid results from Dr Raul Del office sent to [place]  3. HTN Stable No change required today  4. Obesity Weight loss and regular exercise  are encouraged  Return in a year to see cardiology APP  Thompson Grayer MD, [facility] 01/28/2015 3:20 PM

## 2015-02-06 ENCOUNTER — Encounter: Payer: Self-pay | Admitting: Internal Medicine

## 2015-02-27 DIAGNOSIS — R10814 Left lower quadrant abdominal tenderness: Secondary | ICD-10-CM | POA: Diagnosis not present

## 2015-02-27 DIAGNOSIS — Z6836 Body mass index (BMI) 36.0-36.9, adult: Secondary | ICD-10-CM | POA: Diagnosis not present

## 2015-02-27 DIAGNOSIS — K5900 Constipation, unspecified: Secondary | ICD-10-CM | POA: Diagnosis not present

## 2015-02-27 DIAGNOSIS — I10 Essential (primary) hypertension: Secondary | ICD-10-CM | POA: Diagnosis not present

## 2015-02-27 DIAGNOSIS — R10817 Generalized abdominal tenderness: Secondary | ICD-10-CM | POA: Diagnosis not present

## 2015-03-03 ENCOUNTER — Telehealth: Payer: Self-pay | Admitting: Gastroenterology

## 2015-03-03 NOTE — Telephone Encounter (Signed)
Patient is offered an appt with APP for abdominal pain despite 1 week of dexilant from his primary care.  He declines and wants to wait and see Dr. Carlean Purl on 04/23/15 8:30.  He will call back if he changes his mind.

## 2015-03-04 ENCOUNTER — Telehealth: Payer: Self-pay | Admitting: Gastroenterology

## 2015-03-04 NOTE — Telephone Encounter (Signed)
Patient had declined an earlier appt with APP.  He was incorrectly scheduled with Dr. Carlean Purl and I helped reschedule this to 04/08/15 11:15 with Dr. Fuller Plan.  Misty notified that earliest appt with MD.  He declined to see APP.

## 2015-04-08 ENCOUNTER — Encounter: Payer: Self-pay | Admitting: Gastroenterology

## 2015-04-08 ENCOUNTER — Ambulatory Visit (INDEPENDENT_AMBULATORY_CARE_PROVIDER_SITE_OTHER): Payer: Commercial Managed Care - HMO | Admitting: Gastroenterology

## 2015-04-08 VITALS — BP 138/70 | HR 60 | Ht 71.5 in | Wt 256.2 lb

## 2015-04-08 DIAGNOSIS — R1010 Upper abdominal pain, unspecified: Secondary | ICD-10-CM | POA: Diagnosis not present

## 2015-04-08 DIAGNOSIS — Z86010 Personal history of colonic polyps: Secondary | ICD-10-CM

## 2015-04-08 DIAGNOSIS — R140 Abdominal distension (gaseous): Secondary | ICD-10-CM

## 2015-04-08 DIAGNOSIS — R1011 Right upper quadrant pain: Secondary | ICD-10-CM

## 2015-04-08 DIAGNOSIS — R1012 Left upper quadrant pain: Secondary | ICD-10-CM

## 2015-04-08 DIAGNOSIS — K5900 Constipation, unspecified: Secondary | ICD-10-CM | POA: Diagnosis not present

## 2015-04-08 NOTE — Patient Instructions (Signed)
Please take Miralax 17 grams in liquid 1-2 times per day and titrate as needed.

## 2015-04-08 NOTE — Progress Notes (Signed)
    History of Present Illness: This is a 75 year old male presenting for evaluation of constipation and upper abdominal pain/bloating. These symptoms have been present for months. He notes a bowel movement about every 2-4 days but states after 2 days his symptoms are prominent. He has taken prune juice and MiraLAX on an as-needed basis but never on a daily basis to prevent constipation. He states he was seen in Dr. Raul Del office by their PA and had abdominal films that showed findings consistent with constipation. Unfortunately I do not have records available today. Patient underwent colonoscopy in July 2016 that showed 3 small adenomatous colon polyps, mild sigmoid colon diverticulosis and small internal hemorrhoids.  Current Medications, Allergies, Past Medical History, Past Surgical History, Family History and Social History were reviewed in [employer] record.  Physical Exam: General: Well developed, well nourished, no acute distress Head: Normocephalic and atraumatic Eyes:  sclerae anicteric, EOMI Ears: Normal auditory acuity Mouth: No deformity or lesions Lungs: Clear throughout to auscultation Heart: Regular rate and rhythm; no murmurs, rubs or bruits Abdomen: Soft, non tender and non distended. No masses, hepatosplenomegaly or hernias noted. Normal Bowel sounds Musculoskeletal: Symmetrical with no gross deformities  Pulses:  Normal pulses noted Extremities: No clubbing, cyanosis, edema or deformities noted Neurological: Alert oriented x 4, grossly nonfocal Psychological:  Alert and cooperative. Normal mood and affect  Assessment and Recommendations:  1. Constipation associated with upper abdominal pain and bloating. Possible IBS. I advised him to remain on a daily laxative regimen to prevent constipation and the subsequent abdominal pain and bloating. He will try MiraLAX 1 to 2 times per day titrated for at least 1 bowel movement per day. If his symptoms do not  improve on this regimen he can add in daily prune juice. If this is not effective he is to call us in 1 month for consideration of an antispasmotic or Linzess.  2. Personal history of adenomatous colon polyps. Five-year interval surveillance colonoscopy recommended in July 2021.

## 2015-04-23 ENCOUNTER — Ambulatory Visit: Payer: Commercial Managed Care - HMO | Admitting: Internal Medicine

## 2015-05-29 DIAGNOSIS — H2513 Age-related nuclear cataract, bilateral: Secondary | ICD-10-CM | POA: Diagnosis not present

## 2015-05-29 DIAGNOSIS — H04123 Dry eye syndrome of bilateral lacrimal glands: Secondary | ICD-10-CM | POA: Diagnosis not present

## 2015-05-29 DIAGNOSIS — H40053 Ocular hypertension, bilateral: Secondary | ICD-10-CM | POA: Diagnosis not present

## 2015-09-30 ENCOUNTER — Encounter: Payer: Self-pay | Admitting: Physician Assistant

## 2015-11-03 DIAGNOSIS — N401 Enlarged prostate with lower urinary tract symptoms: Secondary | ICD-10-CM | POA: Diagnosis not present

## 2015-11-03 DIAGNOSIS — Z6836 Body mass index (BMI) 36.0-36.9, adult: Secondary | ICD-10-CM | POA: Diagnosis not present

## 2015-11-03 DIAGNOSIS — R309 Painful micturition, unspecified: Secondary | ICD-10-CM | POA: Diagnosis not present

## 2015-11-12 DIAGNOSIS — N401 Enlarged prostate with lower urinary tract symptoms: Secondary | ICD-10-CM | POA: Diagnosis not present

## 2015-11-12 DIAGNOSIS — R3912 Poor urinary stream: Secondary | ICD-10-CM | POA: Diagnosis not present

## 2015-11-12 DIAGNOSIS — R3911 Hesitancy of micturition: Secondary | ICD-10-CM | POA: Diagnosis not present

## 2015-11-27 DIAGNOSIS — H40053 Ocular hypertension, bilateral: Secondary | ICD-10-CM | POA: Diagnosis not present

## 2015-11-27 DIAGNOSIS — H2513 Age-related nuclear cataract, bilateral: Secondary | ICD-10-CM | POA: Diagnosis not present

## 2015-11-27 DIAGNOSIS — H04123 Dry eye syndrome of bilateral lacrimal glands: Secondary | ICD-10-CM | POA: Diagnosis not present

## 2015-12-23 DIAGNOSIS — Z23 Encounter for immunization: Secondary | ICD-10-CM | POA: Diagnosis not present

## 2015-12-25 DIAGNOSIS — E784 Other hyperlipidemia: Secondary | ICD-10-CM | POA: Diagnosis not present

## 2015-12-25 DIAGNOSIS — Z125 Encounter for screening for malignant neoplasm of prostate: Secondary | ICD-10-CM | POA: Diagnosis not present

## 2015-12-25 DIAGNOSIS — I10 Essential (primary) hypertension: Secondary | ICD-10-CM | POA: Diagnosis not present

## 2015-12-25 DIAGNOSIS — R7301 Impaired fasting glucose: Secondary | ICD-10-CM | POA: Diagnosis not present

## 2015-12-30 DIAGNOSIS — Z1212 Encounter for screening for malignant neoplasm of rectum: Secondary | ICD-10-CM | POA: Diagnosis not present

## 2016-01-01 DIAGNOSIS — H40059 Ocular hypertension, unspecified eye: Secondary | ICD-10-CM | POA: Diagnosis not present

## 2016-01-01 DIAGNOSIS — Z0000 Encounter for general adult medical examination without abnormal findings: Secondary | ICD-10-CM | POA: Diagnosis not present

## 2016-01-01 DIAGNOSIS — H8110 Benign paroxysmal vertigo, unspecified ear: Secondary | ICD-10-CM | POA: Diagnosis not present

## 2016-01-01 DIAGNOSIS — R7301 Impaired fasting glucose: Secondary | ICD-10-CM | POA: Diagnosis not present

## 2016-01-01 DIAGNOSIS — Z9861 Coronary angioplasty status: Secondary | ICD-10-CM | POA: Diagnosis not present

## 2016-01-01 DIAGNOSIS — N401 Enlarged prostate with lower urinary tract symptoms: Secondary | ICD-10-CM | POA: Diagnosis not present

## 2016-01-01 DIAGNOSIS — E784 Other hyperlipidemia: Secondary | ICD-10-CM | POA: Diagnosis not present

## 2016-01-01 DIAGNOSIS — K5909 Other constipation: Secondary | ICD-10-CM | POA: Diagnosis not present

## 2016-01-01 DIAGNOSIS — I10 Essential (primary) hypertension: Secondary | ICD-10-CM | POA: Diagnosis not present

## 2016-01-28 ENCOUNTER — Ambulatory Visit: Payer: Commercial Managed Care - HMO | Admitting: Physician Assistant

## 2016-02-11 DIAGNOSIS — N359 Urethral stricture, unspecified: Secondary | ICD-10-CM | POA: Diagnosis not present

## 2016-02-11 DIAGNOSIS — N401 Enlarged prostate with lower urinary tract symptoms: Secondary | ICD-10-CM | POA: Diagnosis not present

## 2016-02-11 DIAGNOSIS — R3912 Poor urinary stream: Secondary | ICD-10-CM | POA: Diagnosis not present

## 2016-08-11 DIAGNOSIS — N401 Enlarged prostate with lower urinary tract symptoms: Secondary | ICD-10-CM | POA: Diagnosis not present

## 2016-08-11 DIAGNOSIS — R3912 Poor urinary stream: Secondary | ICD-10-CM | POA: Diagnosis not present

## 2016-08-11 DIAGNOSIS — N359 Urethral stricture, unspecified: Secondary | ICD-10-CM | POA: Diagnosis not present

## 2016-10-11 DIAGNOSIS — Z6836 Body mass index (BMI) 36.0-36.9, adult: Secondary | ICD-10-CM | POA: Diagnosis not present

## 2016-10-11 DIAGNOSIS — R140 Abdominal distension (gaseous): Secondary | ICD-10-CM | POA: Diagnosis not present

## 2016-10-11 DIAGNOSIS — E784 Other hyperlipidemia: Secondary | ICD-10-CM | POA: Diagnosis not present

## 2016-10-11 DIAGNOSIS — K5909 Other constipation: Secondary | ICD-10-CM | POA: Diagnosis not present

## 2016-10-11 DIAGNOSIS — R252 Cramp and spasm: Secondary | ICD-10-CM | POA: Diagnosis not present

## 2016-10-11 DIAGNOSIS — R5383 Other fatigue: Secondary | ICD-10-CM | POA: Diagnosis not present

## 2016-10-11 DIAGNOSIS — I10 Essential (primary) hypertension: Secondary | ICD-10-CM | POA: Diagnosis not present

## 2016-11-02 DIAGNOSIS — R0602 Shortness of breath: Secondary | ICD-10-CM | POA: Diagnosis not present

## 2016-11-02 DIAGNOSIS — R0781 Pleurodynia: Secondary | ICD-10-CM | POA: Diagnosis not present

## 2016-11-02 DIAGNOSIS — Z6836 Body mass index (BMI) 36.0-36.9, adult: Secondary | ICD-10-CM | POA: Diagnosis not present

## 2016-12-13 ENCOUNTER — Ambulatory Visit: Payer: Commercial Managed Care - HMO | Admitting: Gastroenterology

## 2017-01-06 DIAGNOSIS — R82998 Other abnormal findings in urine: Secondary | ICD-10-CM | POA: Diagnosis not present

## 2017-01-06 DIAGNOSIS — R7301 Impaired fasting glucose: Secondary | ICD-10-CM | POA: Diagnosis not present

## 2017-01-06 DIAGNOSIS — E7849 Other hyperlipidemia: Secondary | ICD-10-CM | POA: Diagnosis not present

## 2017-01-06 DIAGNOSIS — Z125 Encounter for screening for malignant neoplasm of prostate: Secondary | ICD-10-CM | POA: Diagnosis not present

## 2017-01-06 DIAGNOSIS — I10 Essential (primary) hypertension: Secondary | ICD-10-CM | POA: Diagnosis not present

## 2017-01-12 DIAGNOSIS — Z1212 Encounter for screening for malignant neoplasm of rectum: Secondary | ICD-10-CM | POA: Diagnosis not present

## 2017-01-13 DIAGNOSIS — N401 Enlarged prostate with lower urinary tract symptoms: Secondary | ICD-10-CM | POA: Diagnosis not present

## 2017-01-13 DIAGNOSIS — R7301 Impaired fasting glucose: Secondary | ICD-10-CM | POA: Diagnosis not present

## 2017-01-13 DIAGNOSIS — E8881 Metabolic syndrome: Secondary | ICD-10-CM | POA: Diagnosis not present

## 2017-01-13 DIAGNOSIS — E7849 Other hyperlipidemia: Secondary | ICD-10-CM | POA: Diagnosis not present

## 2017-01-13 DIAGNOSIS — K5909 Other constipation: Secondary | ICD-10-CM | POA: Diagnosis not present

## 2017-01-13 DIAGNOSIS — R0781 Pleurodynia: Secondary | ICD-10-CM | POA: Diagnosis not present

## 2017-01-13 DIAGNOSIS — Z9861 Coronary angioplasty status: Secondary | ICD-10-CM | POA: Diagnosis not present

## 2017-01-13 DIAGNOSIS — I10 Essential (primary) hypertension: Secondary | ICD-10-CM | POA: Diagnosis not present

## 2017-01-13 DIAGNOSIS — Z0000 Encounter for general adult medical examination without abnormal findings: Secondary | ICD-10-CM | POA: Diagnosis not present

## 2017-01-23 DIAGNOSIS — M545 Low back pain: Secondary | ICD-10-CM | POA: Diagnosis not present

## 2017-01-23 DIAGNOSIS — M5136 Other intervertebral disc degeneration, lumbar region: Secondary | ICD-10-CM | POA: Diagnosis not present

## 2017-01-24 DIAGNOSIS — H2513 Age-related nuclear cataract, bilateral: Secondary | ICD-10-CM | POA: Diagnosis not present

## 2017-01-24 DIAGNOSIS — H04123 Dry eye syndrome of bilateral lacrimal glands: Secondary | ICD-10-CM | POA: Diagnosis not present

## 2017-01-24 DIAGNOSIS — H40053 Ocular hypertension, bilateral: Secondary | ICD-10-CM | POA: Diagnosis not present

## 2017-03-15 DIAGNOSIS — R3912 Poor urinary stream: Secondary | ICD-10-CM | POA: Diagnosis not present

## 2017-03-15 DIAGNOSIS — N401 Enlarged prostate with lower urinary tract symptoms: Secondary | ICD-10-CM | POA: Diagnosis not present

## 2017-03-15 DIAGNOSIS — N35812 Other urethral bulbous stricture, male: Secondary | ICD-10-CM | POA: Diagnosis not present

## 2017-05-01 DIAGNOSIS — L304 Erythema intertrigo: Secondary | ICD-10-CM | POA: Diagnosis not present

## 2017-05-22 DIAGNOSIS — R05 Cough: Secondary | ICD-10-CM | POA: Diagnosis not present

## 2017-05-22 DIAGNOSIS — Z6835 Body mass index (BMI) 35.0-35.9, adult: Secondary | ICD-10-CM | POA: Diagnosis not present

## 2017-05-22 DIAGNOSIS — J111 Influenza due to unidentified influenza virus with other respiratory manifestations: Secondary | ICD-10-CM | POA: Diagnosis not present

## 2017-06-23 DIAGNOSIS — N401 Enlarged prostate with lower urinary tract symptoms: Secondary | ICD-10-CM | POA: Diagnosis not present

## 2017-06-23 DIAGNOSIS — R3912 Poor urinary stream: Secondary | ICD-10-CM | POA: Diagnosis not present

## 2017-07-06 DIAGNOSIS — M7918 Myalgia, other site: Secondary | ICD-10-CM | POA: Diagnosis not present

## 2017-07-06 DIAGNOSIS — M542 Cervicalgia: Secondary | ICD-10-CM | POA: Diagnosis not present

## 2017-07-06 DIAGNOSIS — M47892 Other spondylosis, cervical region: Secondary | ICD-10-CM | POA: Diagnosis not present

## 2017-09-07 DIAGNOSIS — S40861A Insect bite (nonvenomous) of right upper arm, initial encounter: Secondary | ICD-10-CM | POA: Diagnosis not present

## 2017-09-07 DIAGNOSIS — T63444A Toxic effect of venom of bees, undetermined, initial encounter: Secondary | ICD-10-CM | POA: Diagnosis not present

## 2017-09-07 DIAGNOSIS — Z6836 Body mass index (BMI) 36.0-36.9, adult: Secondary | ICD-10-CM | POA: Diagnosis not present

## 2017-10-23 DIAGNOSIS — R3912 Poor urinary stream: Secondary | ICD-10-CM | POA: Diagnosis not present

## 2017-10-23 DIAGNOSIS — N401 Enlarged prostate with lower urinary tract symptoms: Secondary | ICD-10-CM | POA: Diagnosis not present

## 2017-10-23 DIAGNOSIS — B372 Candidiasis of skin and nail: Secondary | ICD-10-CM | POA: Diagnosis not present

## 2017-11-25 DIAGNOSIS — Z23 Encounter for immunization: Secondary | ICD-10-CM | POA: Diagnosis not present

## 2018-01-19 DIAGNOSIS — Z125 Encounter for screening for malignant neoplasm of prostate: Secondary | ICD-10-CM | POA: Diagnosis not present

## 2018-01-19 DIAGNOSIS — E7849 Other hyperlipidemia: Secondary | ICD-10-CM | POA: Diagnosis not present

## 2018-01-19 DIAGNOSIS — R82998 Other abnormal findings in urine: Secondary | ICD-10-CM | POA: Diagnosis not present

## 2018-01-19 DIAGNOSIS — I10 Essential (primary) hypertension: Secondary | ICD-10-CM | POA: Diagnosis not present

## 2018-01-19 DIAGNOSIS — R7301 Impaired fasting glucose: Secondary | ICD-10-CM | POA: Diagnosis not present

## 2018-01-23 DIAGNOSIS — Z1212 Encounter for screening for malignant neoplasm of rectum: Secondary | ICD-10-CM | POA: Diagnosis not present

## 2018-01-24 DIAGNOSIS — Z0000 Encounter for general adult medical examination without abnormal findings: Secondary | ICD-10-CM | POA: Diagnosis not present

## 2018-01-24 DIAGNOSIS — K5909 Other constipation: Secondary | ICD-10-CM | POA: Diagnosis not present

## 2018-01-24 DIAGNOSIS — N401 Enlarged prostate with lower urinary tract symptoms: Secondary | ICD-10-CM | POA: Diagnosis not present

## 2018-01-24 DIAGNOSIS — H8113 Benign paroxysmal vertigo, bilateral: Secondary | ICD-10-CM | POA: Diagnosis not present

## 2018-01-24 DIAGNOSIS — I10 Essential (primary) hypertension: Secondary | ICD-10-CM | POA: Diagnosis not present

## 2018-01-24 DIAGNOSIS — E7849 Other hyperlipidemia: Secondary | ICD-10-CM | POA: Diagnosis not present

## 2018-01-24 DIAGNOSIS — Z9861 Coronary angioplasty status: Secondary | ICD-10-CM | POA: Diagnosis not present

## 2018-01-24 DIAGNOSIS — R7301 Impaired fasting glucose: Secondary | ICD-10-CM | POA: Diagnosis not present

## 2018-02-01 DIAGNOSIS — H40013 Open angle with borderline findings, low risk, bilateral: Secondary | ICD-10-CM | POA: Diagnosis not present

## 2018-02-01 DIAGNOSIS — H2513 Age-related nuclear cataract, bilateral: Secondary | ICD-10-CM | POA: Diagnosis not present

## 2018-05-25 ENCOUNTER — Encounter: Payer: Self-pay | Admitting: *Deleted

## 2018-05-25 NOTE — Telephone Encounter (Signed)
This encounter was created in error - please disregard.

## 2018-06-27 DIAGNOSIS — I10 Essential (primary) hypertension: Secondary | ICD-10-CM | POA: Diagnosis not present

## 2018-06-27 DIAGNOSIS — J069 Acute upper respiratory infection, unspecified: Secondary | ICD-10-CM | POA: Diagnosis not present

## 2018-09-24 DIAGNOSIS — I10 Essential (primary) hypertension: Secondary | ICD-10-CM | POA: Diagnosis not present

## 2018-09-24 DIAGNOSIS — R000 Tachycardia, unspecified: Secondary | ICD-10-CM | POA: Diagnosis not present

## 2018-09-24 DIAGNOSIS — M542 Cervicalgia: Secondary | ICD-10-CM | POA: Diagnosis not present

## 2018-09-24 DIAGNOSIS — H8110 Benign paroxysmal vertigo, unspecified ear: Secondary | ICD-10-CM | POA: Diagnosis not present

## 2018-12-01 DIAGNOSIS — Z23 Encounter for immunization: Secondary | ICD-10-CM | POA: Diagnosis not present

## 2019-01-21 DIAGNOSIS — Z125 Encounter for screening for malignant neoplasm of prostate: Secondary | ICD-10-CM | POA: Diagnosis not present

## 2019-01-21 DIAGNOSIS — I10 Essential (primary) hypertension: Secondary | ICD-10-CM | POA: Diagnosis not present

## 2019-01-21 DIAGNOSIS — R7301 Impaired fasting glucose: Secondary | ICD-10-CM | POA: Diagnosis not present

## 2019-01-22 DIAGNOSIS — R82998 Other abnormal findings in urine: Secondary | ICD-10-CM | POA: Diagnosis not present

## 2019-01-22 DIAGNOSIS — I10 Essential (primary) hypertension: Secondary | ICD-10-CM | POA: Diagnosis not present

## 2019-01-31 DIAGNOSIS — E8881 Metabolic syndrome: Secondary | ICD-10-CM | POA: Diagnosis not present

## 2019-01-31 DIAGNOSIS — Z9861 Coronary angioplasty status: Secondary | ICD-10-CM | POA: Diagnosis not present

## 2019-01-31 DIAGNOSIS — N401 Enlarged prostate with lower urinary tract symptoms: Secondary | ICD-10-CM | POA: Diagnosis not present

## 2019-01-31 DIAGNOSIS — H8110 Benign paroxysmal vertigo, unspecified ear: Secondary | ICD-10-CM | POA: Diagnosis not present

## 2019-01-31 DIAGNOSIS — E785 Hyperlipidemia, unspecified: Secondary | ICD-10-CM | POA: Diagnosis not present

## 2019-01-31 DIAGNOSIS — Z0000 Encounter for general adult medical examination without abnormal findings: Secondary | ICD-10-CM | POA: Diagnosis not present

## 2019-01-31 DIAGNOSIS — I10 Essential (primary) hypertension: Secondary | ICD-10-CM | POA: Diagnosis not present

## 2019-01-31 DIAGNOSIS — R7301 Impaired fasting glucose: Secondary | ICD-10-CM | POA: Diagnosis not present

## 2019-02-06 DIAGNOSIS — H2513 Age-related nuclear cataract, bilateral: Secondary | ICD-10-CM | POA: Diagnosis not present

## 2019-02-06 DIAGNOSIS — H40013 Open angle with borderline findings, low risk, bilateral: Secondary | ICD-10-CM | POA: Diagnosis not present

## 2019-06-25 DIAGNOSIS — M25551 Pain in right hip: Secondary | ICD-10-CM | POA: Diagnosis not present

## 2019-06-25 DIAGNOSIS — M545 Low back pain: Secondary | ICD-10-CM | POA: Diagnosis not present

## 2019-07-30 ENCOUNTER — Encounter: Payer: Self-pay | Admitting: Gastroenterology

## 2019-07-31 DIAGNOSIS — N401 Enlarged prostate with lower urinary tract symptoms: Secondary | ICD-10-CM | POA: Diagnosis not present

## 2019-07-31 DIAGNOSIS — R300 Dysuria: Secondary | ICD-10-CM | POA: Diagnosis not present

## 2019-07-31 DIAGNOSIS — R3912 Poor urinary stream: Secondary | ICD-10-CM | POA: Diagnosis not present

## 2019-10-22 DIAGNOSIS — K5909 Other constipation: Secondary | ICD-10-CM | POA: Diagnosis not present

## 2019-10-22 DIAGNOSIS — R1084 Generalized abdominal pain: Secondary | ICD-10-CM | POA: Diagnosis not present

## 2019-10-22 DIAGNOSIS — K5792 Diverticulitis of intestine, part unspecified, without perforation or abscess without bleeding: Secondary | ICD-10-CM | POA: Diagnosis not present

## 2019-10-30 DIAGNOSIS — B3781 Candidal esophagitis: Secondary | ICD-10-CM | POA: Diagnosis not present

## 2019-10-30 DIAGNOSIS — B370 Candidal stomatitis: Secondary | ICD-10-CM | POA: Diagnosis not present

## 2019-10-30 DIAGNOSIS — K2100 Gastro-esophageal reflux disease with esophagitis, without bleeding: Secondary | ICD-10-CM | POA: Diagnosis not present

## 2019-12-07 DIAGNOSIS — Z23 Encounter for immunization: Secondary | ICD-10-CM | POA: Diagnosis not present

## 2020-01-30 DIAGNOSIS — E785 Hyperlipidemia, unspecified: Secondary | ICD-10-CM | POA: Diagnosis not present

## 2020-01-30 DIAGNOSIS — R7301 Impaired fasting glucose: Secondary | ICD-10-CM | POA: Diagnosis not present

## 2020-01-30 DIAGNOSIS — Z125 Encounter for screening for malignant neoplasm of prostate: Secondary | ICD-10-CM | POA: Diagnosis not present

## 2020-02-04 DIAGNOSIS — R82998 Other abnormal findings in urine: Secondary | ICD-10-CM | POA: Diagnosis not present

## 2020-02-05 DIAGNOSIS — E8881 Metabolic syndrome: Secondary | ICD-10-CM | POA: Diagnosis not present

## 2020-02-05 DIAGNOSIS — I10 Essential (primary) hypertension: Secondary | ICD-10-CM | POA: Diagnosis not present

## 2020-02-05 DIAGNOSIS — E785 Hyperlipidemia, unspecified: Secondary | ICD-10-CM | POA: Diagnosis not present

## 2020-02-05 DIAGNOSIS — R7301 Impaired fasting glucose: Secondary | ICD-10-CM | POA: Diagnosis not present

## 2020-02-05 DIAGNOSIS — Z1331 Encounter for screening for depression: Secondary | ICD-10-CM | POA: Diagnosis not present

## 2020-02-05 DIAGNOSIS — I2510 Atherosclerotic heart disease of native coronary artery without angina pectoris: Secondary | ICD-10-CM | POA: Diagnosis not present

## 2020-02-05 DIAGNOSIS — Z9861 Coronary angioplasty status: Secondary | ICD-10-CM | POA: Diagnosis not present

## 2020-02-05 DIAGNOSIS — Z0000 Encounter for general adult medical examination without abnormal findings: Secondary | ICD-10-CM | POA: Diagnosis not present

## 2020-02-05 DIAGNOSIS — N401 Enlarged prostate with lower urinary tract symptoms: Secondary | ICD-10-CM | POA: Diagnosis not present

## 2020-02-11 DIAGNOSIS — D3131 Benign neoplasm of right choroid: Secondary | ICD-10-CM | POA: Diagnosis not present

## 2020-02-11 DIAGNOSIS — H2513 Age-related nuclear cataract, bilateral: Secondary | ICD-10-CM | POA: Diagnosis not present

## 2020-02-11 DIAGNOSIS — H04201 Unspecified epiphora, right lacrimal gland: Secondary | ICD-10-CM | POA: Diagnosis not present

## 2020-02-11 DIAGNOSIS — H04123 Dry eye syndrome of bilateral lacrimal glands: Secondary | ICD-10-CM | POA: Diagnosis not present

## 2020-02-11 DIAGNOSIS — H40013 Open angle with borderline findings, low risk, bilateral: Secondary | ICD-10-CM | POA: Diagnosis not present

## 2020-02-11 DIAGNOSIS — H43812 Vitreous degeneration, left eye: Secondary | ICD-10-CM | POA: Diagnosis not present

## 2020-08-05 DIAGNOSIS — R3912 Poor urinary stream: Secondary | ICD-10-CM | POA: Diagnosis not present

## 2020-08-05 DIAGNOSIS — N401 Enlarged prostate with lower urinary tract symptoms: Secondary | ICD-10-CM | POA: Diagnosis not present

## 2020-10-28 DIAGNOSIS — I10 Essential (primary) hypertension: Secondary | ICD-10-CM | POA: Diagnosis not present

## 2020-10-28 DIAGNOSIS — N401 Enlarged prostate with lower urinary tract symptoms: Secondary | ICD-10-CM | POA: Diagnosis not present

## 2020-10-28 DIAGNOSIS — E785 Hyperlipidemia, unspecified: Secondary | ICD-10-CM | POA: Diagnosis not present

## 2020-10-28 DIAGNOSIS — I2510 Atherosclerotic heart disease of native coronary artery without angina pectoris: Secondary | ICD-10-CM | POA: Diagnosis not present

## 2020-11-27 DIAGNOSIS — I2510 Atherosclerotic heart disease of native coronary artery without angina pectoris: Secondary | ICD-10-CM | POA: Diagnosis not present

## 2020-11-27 DIAGNOSIS — N401 Enlarged prostate with lower urinary tract symptoms: Secondary | ICD-10-CM | POA: Diagnosis not present

## 2020-11-27 DIAGNOSIS — I10 Essential (primary) hypertension: Secondary | ICD-10-CM | POA: Diagnosis not present

## 2020-11-27 DIAGNOSIS — E785 Hyperlipidemia, unspecified: Secondary | ICD-10-CM | POA: Diagnosis not present

## 2020-12-05 DIAGNOSIS — Z23 Encounter for immunization: Secondary | ICD-10-CM | POA: Diagnosis not present

## 2020-12-28 DIAGNOSIS — I2510 Atherosclerotic heart disease of native coronary artery without angina pectoris: Secondary | ICD-10-CM | POA: Diagnosis not present

## 2020-12-28 DIAGNOSIS — E785 Hyperlipidemia, unspecified: Secondary | ICD-10-CM | POA: Diagnosis not present

## 2020-12-28 DIAGNOSIS — I10 Essential (primary) hypertension: Secondary | ICD-10-CM | POA: Diagnosis not present

## 2021-02-11 DIAGNOSIS — D3131 Benign neoplasm of right choroid: Secondary | ICD-10-CM | POA: Diagnosis not present

## 2021-02-11 DIAGNOSIS — H43812 Vitreous degeneration, left eye: Secondary | ICD-10-CM | POA: Diagnosis not present

## 2021-02-11 DIAGNOSIS — H40013 Open angle with borderline findings, low risk, bilateral: Secondary | ICD-10-CM | POA: Diagnosis not present

## 2021-02-11 DIAGNOSIS — H2513 Age-related nuclear cataract, bilateral: Secondary | ICD-10-CM | POA: Diagnosis not present

## 2021-02-11 DIAGNOSIS — H04123 Dry eye syndrome of bilateral lacrimal glands: Secondary | ICD-10-CM | POA: Diagnosis not present

## 2021-02-11 DIAGNOSIS — H04201 Unspecified epiphora, right lacrimal gland: Secondary | ICD-10-CM | POA: Diagnosis not present

## 2021-02-24 ENCOUNTER — Other Ambulatory Visit: Payer: Self-pay | Admitting: Urology

## 2021-03-10 DIAGNOSIS — Z125 Encounter for screening for malignant neoplasm of prostate: Secondary | ICD-10-CM | POA: Diagnosis not present

## 2021-03-10 DIAGNOSIS — I10 Essential (primary) hypertension: Secondary | ICD-10-CM | POA: Diagnosis not present

## 2021-03-10 DIAGNOSIS — E785 Hyperlipidemia, unspecified: Secondary | ICD-10-CM | POA: Diagnosis not present

## 2021-03-10 DIAGNOSIS — R7301 Impaired fasting glucose: Secondary | ICD-10-CM | POA: Diagnosis not present

## 2021-03-17 DIAGNOSIS — Z9861 Coronary angioplasty status: Secondary | ICD-10-CM | POA: Diagnosis not present

## 2021-03-17 DIAGNOSIS — R8290 Unspecified abnormal findings in urine: Secondary | ICD-10-CM | POA: Diagnosis not present

## 2021-03-17 DIAGNOSIS — Z0000 Encounter for general adult medical examination without abnormal findings: Secondary | ICD-10-CM | POA: Diagnosis not present

## 2021-03-17 DIAGNOSIS — H8110 Benign paroxysmal vertigo, unspecified ear: Secondary | ICD-10-CM | POA: Diagnosis not present

## 2021-03-17 DIAGNOSIS — I10 Essential (primary) hypertension: Secondary | ICD-10-CM | POA: Diagnosis not present

## 2021-03-17 DIAGNOSIS — Z1331 Encounter for screening for depression: Secondary | ICD-10-CM | POA: Diagnosis not present

## 2021-03-17 DIAGNOSIS — R7301 Impaired fasting glucose: Secondary | ICD-10-CM | POA: Diagnosis not present

## 2021-03-17 DIAGNOSIS — I2510 Atherosclerotic heart disease of native coronary artery without angina pectoris: Secondary | ICD-10-CM | POA: Diagnosis not present

## 2021-03-17 DIAGNOSIS — Z1339 Encounter for screening examination for other mental health and behavioral disorders: Secondary | ICD-10-CM | POA: Diagnosis not present

## 2021-03-17 DIAGNOSIS — E785 Hyperlipidemia, unspecified: Secondary | ICD-10-CM | POA: Diagnosis not present

## 2021-03-17 DIAGNOSIS — E8881 Metabolic syndrome: Secondary | ICD-10-CM | POA: Diagnosis not present

## 2021-04-14 ENCOUNTER — Ambulatory Visit: Payer: Medicare HMO | Admitting: Internal Medicine

## 2021-04-14 ENCOUNTER — Other Ambulatory Visit: Payer: Self-pay

## 2021-04-14 ENCOUNTER — Encounter: Payer: Self-pay | Admitting: Internal Medicine

## 2021-04-14 VITALS — BP 135/75 | HR 58 | Ht 71.0 in | Wt 233.3 lb

## 2021-04-14 DIAGNOSIS — R600 Localized edema: Secondary | ICD-10-CM | POA: Diagnosis not present

## 2021-04-14 DIAGNOSIS — I2510 Atherosclerotic heart disease of native coronary artery without angina pectoris: Secondary | ICD-10-CM | POA: Diagnosis not present

## 2021-04-14 DIAGNOSIS — E782 Mixed hyperlipidemia: Secondary | ICD-10-CM | POA: Diagnosis not present

## 2021-04-14 DIAGNOSIS — I10 Essential (primary) hypertension: Secondary | ICD-10-CM

## 2021-04-14 MED ORDER — NEBIVOLOL HCL 10 MG PO TABS: 10.0000 mg | ORAL_TABLET | Freq: Every day | ORAL | 3 refills | Status: DC

## 2021-04-14 MED ORDER — CARVEDILOL 6.25 MG PO TABS: 6.2500 mg | ORAL_TABLET | Freq: Two times a day (BID) | ORAL | 11 refills | Status: DC

## 2021-04-14 NOTE — Progress Notes (Signed)
Cardiology Office Note:    Date:  04/14/2021   ID:  Wesley Johnson, DOB September 11, 1940, MRN [id number]  PCP:  Ginger Organ., MD   [facility] Providers Cardiologist:  Werner Lean, MD     Referring MD: Ginger Organ., MD   CC:  Consulted for the evaluation of CAD at the behest of Ginger Organ., MD  History of Present Illness:    Wesley Johnson is a 81 y.o. male with a hx of CAD (prior Ramus PCI 2012), HLD, HTN who presents to establish care 04/14/2021.  Prior Allred/Lori Patient 2012-2016.  Patient reports that since last being seen his medications have been managed by his primary care doctor.  He has been doing well except he was on nebivolol but the cost became too much so approximately 6 weeks ago he was transitioned to bisoprolol.  He reports that since being transition to bisoprolol he has noticed abdominal pain, back pain, and bilateral upper extremity pain.  He reports that this pain started as soon as he switch medications and he feels that it is related to the medication switch.  Patient does have history of coronary artery disease with heart catheterization and stent placement.  He reports that when he was having issues with his heart in the past his symptoms were chest pressure as well as upper extremity pain and upper back pain and that his current pain is not consistent with the previous pain experienced.  Patient reports that while on previous medications he was able to walk a mile and approximately 18 minutes but has been unable to do so since transitioning due to pain.  He also reports that his blood pressures have been slightly higher than they were prior to switching medications.  He denies any shortness of breath, DOE, leg swelling.   Past Medical History:  Diagnosis Date   BPH (benign prostatic hyperplasia)    CAD (coronary artery disease) 10/04/2010   s/p PCI of the Ramus, nonobstructive disease of LAD, LCx, and RCA   Diverticulosis     DJD (degenerative joint disease)    Hemorrhoids    Hyperlipidemia    Hypertension    IBS (irritable bowel syndrome)    Impaired fasting glucose    Insomnia    Lumbar stenosis    Metabolic syndrome    Tubular adenoma of colon 07/29/2005    Past Surgical History:  Procedure Laterality Date   CORONARY ANGIOPLASTY WITH STENT PLACEMENT  10/04/10   PCI Ramus with Promus DES    Current Medications: Current Meds  Medication Sig   aspirin 81 MG chewable tablet Chew 81 mg by mouth daily.     ciprofloxacin (CIPRO) 250 MG tablet Take 250 mg by mouth as needed.   cyclobenzaprine (FLEXERIL) 10 MG tablet Take 1 tablet (10 mg total) by mouth 2 (two) times daily as needed for muscle spasms (do not use with other muscle relaxers such as methocarbomol).   diclofenac sodium (VOLTAREN) 1 % GEL Apply 2 g topically as needed.   finasteride (PROSCAR) 5 MG tablet Take 1 tablet (5 mg total) by mouth daily.   Hydrocortisone-Iodoquinol 1-1 % CREA as needed.   losartan (COZAAR) 100 MG tablet TAKE ONE TABLET BY MOUTH EVERY DAY   nebivolol (BYSTOLIC) 10 MG tablet Take 1 tablet (10 mg total) by mouth daily.   nitroGLYCERIN (NITROSTAT) 0.4 MG SL tablet Place 1 tablet (0.4 mg total) under the tongue every 5 (five) minutes as needed for  chest pain (MAX 3 TABLETS).   polyethylene glycol powder (GLYCOLAX/MIRALAX) powder Take 17 g by mouth daily as needed for mild constipation.    promethazine (PHENERGAN) 25 MG tablet Take 1 tablet (25 mg total) by mouth every 6 (six) hours as needed for nausea.   rosuvastatin (CRESTOR) 10 MG tablet Take 10 mg by mouth at bedtime.   tamsulosin (FLOMAX) 0.4 MG CAPS capsule Take 0.4 mg by mouth daily.   [DISCONTINUED] bisoprolol (ZEBETA) 10 MG tablet Take 10 mg by mouth daily.   [DISCONTINUED] carvedilol (COREG) 6.25 MG tablet Take 1 tablet (6.25 mg total) by mouth 2 (two) times daily.   [DISCONTINUED] nebivolol (BYSTOLIC) 10 MG tablet Take 1 tablet (10 mg total) by mouth daily.    [DISCONTINUED] pravastatin (PRAVACHOL) 40 MG tablet Take 1 tablet (40 mg total) by mouth daily.     Allergies:   Ofloxacin   Social History   Socioeconomic History   Marital status: Widowed    Spouse name: Not on file   Number of children: 2   Years of education: Not on file   Highest education level: Not on file  Occupational History   Occupation: Retired   Tobacco Use   Smoking status: Former    Types: Cigarettes    Quit date: 09/30/1975    Years since quitting: 45.5   Smokeless tobacco: Never   Tobacco comments:    quit in 1977 smoked 1.5 -2 ppd  Substance and Sexual Activity   Alcohol use: No    Alcohol/week: 0.0 standard drinks   Drug use: No   Sexual activity: Yes  Other Topics Concern   Not on file  Social History Narrative   Lives in [place] with son.  Widowed.   Pt is retired from a [employer].   Social Determinants of Health   Financial Resource Strain: Not on file  Food Insecurity: Not on file  Transportation Needs: Not on file  Physical Activity: Not on file  Stress: Not on file  Social Connections: Not on file     Family History: The patient's family history includes Colon cancer in his paternal grandfather; Colon polyps in an other family member; Hypertension in an other family member; Lung cancer in his father; Stroke in his mother.  ROS:   Please see the history of present illness.     All other systems reviewed and are negative.  EKGs/Labs/Other Studies Reviewed:    The following studies were reviewed today:   EKG:  EKG is  ordered today.  The ekg ordered today demonstrates  04/14/21:   Recent Labs: No results found for requested labs within last 8760 hours.  Recent Lipid Panel    Component Value Date/Time   CHOL 101 06/10/2013 0958   TRIG 151.0 (H) 06/10/2013 0958   HDL 24.70 (L) 06/10/2013 0958   CHOLHDL 4 06/10/2013 0958   VLDL 30.2 06/10/2013 0958   LDLCALC 46 06/10/2013 0958   LDLDIRECT 102.1 09/08/2009 1317         Physical Exam:    VS:  BP 135/75    Pulse (!) 58    Ht 5\' 11"  (1.803 m)    Wt 233 lb 4.8 oz (105.8 kg)    SpO2 98%    BMI 32.54 kg/m     Wt Readings from Last 3 Encounters:  04/14/21 233 lb 4.8 oz (105.8 kg)  04/08/15 256 lb 4 oz (116.2 kg)  01/28/15 258 lb 12.8 oz (117.4 kg)     Gen: Pleasant  81 year old male in no acute distress Neck: No JVD appreciated Ears: Normal bilaterally Cardiac: Regular rate and rhythm, no murmurs appreciated equal radial pulses Respiratory: Normal work of breathing, lungs clear to auscultation bilaterally, no wheezes or crackles appreciated GI: Soft, nontender, positive bowel sounds MS: Trace lower extremity edema bilaterally Integument: Warm, dry Neuro:  At time of evaluation, alert and oriented to person/place/time/situation  Psych: Normal affect,   ASSESSMENT:    1. CAD in native artery   2. Bilateral lower extremity edema    PLAN:    Coronary Artery Disease; Obstructive HTN HLD Patient currently not feeling well since the transition to bisoprolol from nebivolol.  Denies any chest pressure or shortness of breath.  Trace pitting edema noted in lower extremities. - Scheduling for echocardiogram - Had long discussion with the patient regarding beta-blocker.  There is concerned that he will be unable to afford nebivolol if we transition back to this medication.  Discussed transition to carvedilol but ultimately patient decided he would pay for the nebivolol.  We will readdress this if financial issues occur - Continue aspirin 81 mg daily - Continue rosuvastatin with LDL goal less than 50 (most recent LDL of 43) - Continue losartan - Continue nitro as needed           Medication Adjustments/Labs and Tests Ordered: Current medicines are reviewed at length with the patient today.  Concerns regarding medicines are outlined above.  Orders Placed This Encounter  Procedures   ECHOCARDIOGRAM COMPLETE   Meds ordered this encounter   Medications   DISCONTD: carvedilol (COREG) 6.25 MG tablet    Sig: Take 1 tablet (6.25 mg total) by mouth 2 (two) times daily.    Dispense:  60 tablet    Refill:  11   DISCONTD: nebivolol (BYSTOLIC) 10 MG tablet    Sig: Take 1 tablet (10 mg total) by mouth daily.    Dispense:  90 tablet    Refill:  3   nebivolol (BYSTOLIC) 10 MG tablet    Sig: Take 1 tablet (10 mg total) by mouth daily.    Dispense:  90 tablet    Refill:  3    Please only give pt the brand name medication per pt request.    Patient Instructions  Medication Instructions:  Your physician has recommended you make the following change in your medication:  STOP: bisoprolol  START: nebivolol ( Bystolic) 10 mg by mouth once daily sent a message to pharmacy requesting Bystolic only no generic medication.  I have provided you with 1 month of samples  *If you need a refill on your cardiac medications before your next appointment, please call your pharmacy*   Lab Work: NONE If you have labs (blood work) drawn today and your tests are completely normal, you will receive your results only by: [place] (if you have MyChart) OR A paper copy in the mail If you have any lab test that is abnormal or we need to change your treatment, we will call you to review the results.   Testing/Procedures: Your physician has requested that you have an echocardiogram. Echocardiography is a painless test that uses sound waves to create images of your heart. It provides your doctor with information about the size and shape of your heart and how well your hearts chambers and valves are working. This procedure takes approximately one hour. There are no restrictions for this procedure.    Follow-Up: At [facility], you and your health needs are our priority.  As part of our continuing mission to provide you with exceptional heart care, we have created designated Provider Care Teams.  These Care Teams include your primary Cardiologist  (physician) and Advanced Practice Providers (APPs -  Physician Assistants and Nurse Practitioners) who all work together to provide you with the care you need, when you need it.  We recommend signing up for the patient portal called "MyChart".  Sign up information is provided on this After Visit Summary.  MyChart is used to connect with patients for Virtual Visits (Telemedicine).  Patients are able to view lab/test results, encounter notes, upcoming appointments, etc.  Non-urgent messages can be sent to your provider as well.   To learn more about what you can do with MyChart, go to [email or web address].    Your next appointment:   4 - 5 month(s)  The format for your next appointment:   In Person  Provider:   Werner Lean, MD      Signed, Gifford Shave, MD  04/14/2021 5:23 PM    [place]

## 2021-04-14 NOTE — Progress Notes (Signed)
Cardiology Office Note:    Date:  04/14/2021   ID:  CALE BETHARD, DOB [birth year], MRN [id number]  PCP:  Ginger Organ., MD   [facility] Providers Cardiologist:  Werner Lean, MD     Referring MD: Ginger Organ., MD   CC: Establishing care Consulted for the evaluation of CAD at the behest of Ginger Organ., MD  History of Present Illness:    Wesley Johnson is a 81 y.o. male with a hx of CAD (prior Ramus PCI 2012), HLD, HTN who presents to establish care 04/14/2021.  Prior Allred/Lori Patient 2012-2016.  Patient notes that he is feeling fine.   Back in 2012 he and is family were walking around the graveyard and he developed chest pain associated with SOB.  Since last follow up has had no further chest pain.  Prior to stopping bystolic, he has had no chest pain.  Since trying other beta blockers (bisoprolol, succinate) he has had chest pain that radiates to his back.  This is different that his anginal pain.  He has some old bystolic 10 mg, and when he stops the other beta-blockers his pain resolves (bystolic was stopped for financial reasons).  He still walks around the graveyard with his family with no symptoms.  No SOB, no DOE, No palpitations, no syncope. He denies LE edema, weight gain.    Past Medical History:  Diagnosis Date   BPH (benign prostatic hyperplasia)    CAD (coronary artery disease) 10/04/2010   s/p PCI of the Ramus, nonobstructive disease of LAD, LCx, and RCA   Diverticulosis    DJD (degenerative joint disease)    Hemorrhoids    Hyperlipidemia    Hypertension    IBS (irritable bowel syndrome)    Impaired fasting glucose    Insomnia    Lumbar stenosis    Metabolic syndrome    Tubular adenoma of colon 07/29/2005    Past Surgical History:  Procedure Laterality Date   CORONARY ANGIOPLASTY WITH STENT PLACEMENT  10/04/10   PCI Ramus with Promus DES    Current Medications: Current Meds  Medication Sig   aspirin 81 MG  chewable tablet Chew 81 mg by mouth daily.     ciprofloxacin (CIPRO) 250 MG tablet Take 250 mg by mouth as needed.   cyclobenzaprine (FLEXERIL) 10 MG tablet Take 1 tablet (10 mg total) by mouth 2 (two) times daily as needed for muscle spasms (do not use with other muscle relaxers such as methocarbomol).   diclofenac sodium (VOLTAREN) 1 % GEL Apply 2 g topically as needed.   finasteride (PROSCAR) 5 MG tablet Take 1 tablet (5 mg total) by mouth daily.   Hydrocortisone-Iodoquinol 1-1 % CREA as needed.   losartan (COZAAR) 100 MG tablet TAKE ONE TABLET BY MOUTH EVERY DAY   nebivolol (BYSTOLIC) 10 MG tablet Take 1 tablet (10 mg total) by mouth daily.   nitroGLYCERIN (NITROSTAT) 0.4 MG SL tablet Place 1 tablet (0.4 mg total) under the tongue every 5 (five) minutes as needed for chest pain (MAX 3 TABLETS).   polyethylene glycol powder (GLYCOLAX/MIRALAX) powder Take 17 g by mouth daily as needed for mild constipation.    promethazine (PHENERGAN) 25 MG tablet Take 1 tablet (25 mg total) by mouth every 6 (six) hours as needed for nausea.   rosuvastatin (CRESTOR) 10 MG tablet Take 10 mg by mouth at bedtime.   tamsulosin (FLOMAX) 0.4 MG CAPS capsule Take 0.4 mg by mouth daily.   [  DISCONTINUED] bisoprolol (ZEBETA) 10 MG tablet Take 10 mg by mouth daily.   [DISCONTINUED] carvedilol (COREG) 6.25 MG tablet Take 1 tablet (6.25 mg total) by mouth 2 (two) times daily.   [DISCONTINUED] nebivolol (BYSTOLIC) 10 MG tablet Take 1 tablet (10 mg total) by mouth daily.   [DISCONTINUED] pravastatin (PRAVACHOL) 40 MG tablet Take 1 tablet (40 mg total) by mouth daily.     Allergies:   Ofloxacin   Social History   Socioeconomic History   Marital status: Widowed    Spouse name: Not on file   Number of children: 2   Years of education: Not on file   Highest education level: Not on file  Occupational History   Occupation: Retired   Tobacco Use   Smoking status: Former    Types: Cigarettes    Quit date: 09/30/1975     Years since quitting: 45.5   Smokeless tobacco: Never   Tobacco comments:    quit in 1977 smoked 1.5 -2 ppd  Substance and Sexual Activity   Alcohol use: No    Alcohol/week: 0.0 standard drinks   Drug use: No   Sexual activity: Yes  Other Topics Concern   Not on file  Social History Narrative   Lives in [place] with son.  Widowed.   Pt is retired from a [employer].   Social Determinants of Health   Financial Resource Strain: Not on file  Food Insecurity: Not on file  Transportation Needs: Not on file  Physical Activity: Not on file  Stress: Not on file  Social Connections: Not on file     Family History: The patient's family history includes Colon cancer in his paternal grandfather; Colon polyps in an other family member; Hypertension in an other family member; Lung cancer in his father; Stroke in his mother.  ROS:   Please see the history of present illness.     All other systems reviewed and are negative.  EKGs/Labs/Other Studies Reviewed:    The following studies were reviewed today:  EKG:  EKG is  ordered today.  The ekg ordered today demonstrates  04/14/21: Sinus bradycardia 1st HB Q wave in III  Recent Labs: No results found for requested labs within last 8760 hours.  Recent Lipid Panel    Component Value Date/Time   CHOL 101 06/10/2013 0958   TRIG 151.0 (H) 06/10/2013 0958   HDL 24.70 (L) 06/10/2013 0958   CHOLHDL 4 06/10/2013 0958   VLDL 30.2 06/10/2013 0958   LDLCALC 46 06/10/2013 0958   LDLDIRECT 102.1 09/08/2009 1317        Physical Exam:    VS:  BP 135/75    Pulse (!) 58    Ht 5\' 11"  (1.803 m)    Wt 105.8 kg    SpO2 98%    BMI 32.54 kg/m     Wt Readings from Last 3 Encounters:  04/14/21 105.8 kg  04/08/15 116.2 kg  01/28/15 117.4 kg     Gen: no distress, elderly male  Neck: No JVD Cardiac: No Rubs or Gallops, no Murmur, regular bradycardia, +2 radial pulses Respiratory: Clear to auscultation bilaterally, normal effort, normal   respiratory rate GI: Soft, nontender, non-distended  MS: +1 bilateral LE edema;  moves all extremities Integument: Skin feels well Neuro:  At time of evaluation, alert and oriented to person/place/time/situation  Psych: Normal affect, patient feels ok   ASSESSMENT:    1. CAD in native artery   2. Bilateral lower extremity edema   3.  Mixed hyperlipidemia   4. Essential hypertension    PLAN:    Coronary Artery Disease; Obstructive HTN HLD New LE edema - symptomatic off but on bystolic with stable angina - anatomy: obstructive ramus disease s/p PCI - continue ASA 81 mg - continue rosuvastatin, goal LDL < 55 (at goal 02/2021) - I offered coreg, but patient is adamant that his body handles certain medications differently; I expressed my concern that cost issues would bring up back to where he was prior; he understands the risk and would like to return back to bystolic 10 mg PO daily; this is reasonable so long as he understands the financial risk - continue losartan - given new LE edema (attributed to wearing his socks tight by patient) will get echo, low threshold to start diuretic -when following up echo results, if he still have CP back on his bystolic, we will get lexican (discussed at this visit)  4-5 months with me    Seen with resident; agree with note with my clarifications   Medication Adjustments/Labs and Tests Ordered: Current medicines are reviewed at length with the patient today.  Concerns regarding medicines are outlined above.  Orders Placed This Encounter  Procedures   ECHOCARDIOGRAM COMPLETE   Meds ordered this encounter  Medications   DISCONTD: carvedilol (COREG) 6.25 MG tablet    Sig: Take 1 tablet (6.25 mg total) by mouth 2 (two) times daily.    Dispense:  60 tablet    Refill:  11   DISCONTD: nebivolol (BYSTOLIC) 10 MG tablet    Sig: Take 1 tablet (10 mg total) by mouth daily.    Dispense:  90 tablet    Refill:  3   nebivolol (BYSTOLIC) 10 MG tablet     Sig: Take 1 tablet (10 mg total) by mouth daily.    Dispense:  90 tablet    Refill:  3    Please only give pt the brand name medication per pt request.    Patient Instructions  Medication Instructions:  Your physician has recommended you make the following change in your medication:  STOP: bisoprolol  START: nebivolol ( Bystolic) 10 mg by mouth once daily sent a message to pharmacy requesting Bystolic only no generic medication.  I have provided you with 1 month of samples  *If you need a refill on your cardiac medications before your next appointment, please call your pharmacy*   Lab Work: NONE If you have labs (blood work) drawn today and your tests are completely normal, you will receive your results only by: [place] (if you have MyChart) OR A paper copy in the mail If you have any lab test that is abnormal or we need to change your treatment, we will call you to review the results.   Testing/Procedures: Your physician has requested that you have an echocardiogram. Echocardiography is a painless test that uses sound waves to create images of your heart. It provides your doctor with information about the size and shape of your heart and how well your hearts chambers and valves are working. This procedure takes approximately one hour. There are no restrictions for this procedure.    Follow-Up: At [facility], you and your health needs are our priority.  As part of our continuing mission to provide you with exceptional heart care, we have created designated Provider Care Teams.  These Care Teams include your primary Cardiologist (physician) and Advanced Practice Providers (APPs -  Physician Assistants and Nurse Practitioners) who all work together to  provide you with the care you need, when you need it.  We recommend signing up for the patient portal called "MyChart".  Sign up information is provided on this After Visit Summary.  MyChart is used to connect with patients  for Virtual Visits (Telemedicine).  Patients are able to view lab/test results, encounter notes, upcoming appointments, etc.  Non-urgent messages can be sent to your provider as well.   To learn more about what you can do with MyChart, go to [email or web address].    Your next appointment:   4 - 5 month(s)  The format for your next appointment:   In Person  Provider:   Werner Lean, MD      Signed, Werner Lean, MD  04/14/2021 5:28 PM    [place]

## 2021-04-14 NOTE — Patient Instructions (Addendum)
Medication Instructions:  Your physician has recommended you make the following change in your medication:  STOP: bisoprolol  START: nebivolol ( Bystolic) 10 mg by mouth once daily sent a message to pharmacy requesting Bystolic only no generic medication.  I have provided you with 1 month of samples  *If you need a refill on your cardiac medications before your next appointment, please call your pharmacy*   Lab Work: NONE If you have labs (blood work) drawn today and your tests are completely normal, you will receive your results only by: [place] (if you have MyChart) OR A paper copy in the mail If you have any lab test that is abnormal or we need to change your treatment, we will call you to review the results.   Testing/Procedures: Your physician has requested that you have an echocardiogram. Echocardiography is a painless test that uses sound waves to create images of your heart. It provides your doctor with information about the size and shape of your heart and how well your hearts chambers and valves are working. This procedure takes approximately one hour. There are no restrictions for this procedure.    Follow-Up: At [facility], you and your health needs are our priority.  As part of our continuing mission to provide you with exceptional heart care, we have created designated Provider Care Teams.  These Care Teams include your primary Cardiologist (physician) and Advanced Practice Providers (APPs -  Physician Assistants and Nurse Practitioners) who all work together to provide you with the care you need, when you need it.  We recommend signing up for the patient portal called "MyChart".  Sign up information is provided on this After Visit Summary.  MyChart is used to connect with patients for Virtual Visits (Telemedicine).  Patients are able to view lab/test results, encounter notes, upcoming appointments, etc.  Non-urgent messages can be sent to your provider as well.    To learn more about what you can do with MyChart, go to [email or web address].    Your next appointment:   4 - 5 month(s)  The format for your next appointment:   In Person  Provider:   Werner Lean, MD

## 2021-04-20 NOTE — Addendum Note (Signed)
Addended by: Gaetano Net on: 04/20/2021 06:55 AM   Modules accepted: Orders

## 2021-04-26 ENCOUNTER — Telehealth: Payer: Self-pay | Admitting: Internal Medicine

## 2021-04-26 NOTE — Telephone Encounter (Signed)
Left a message to call back.

## 2021-04-26 NOTE — Telephone Encounter (Signed)
Pt is returning call.  

## 2021-04-26 NOTE — Telephone Encounter (Signed)
Called pt reviewed MD recommendations. Pt reports mainly wanted to let our office know that can not lay flat.  Causes head to spin, room to spin and becomes nauseous and sometimes vomits.  Have sent a staff message to echo staff informing of pt concern.

## 2021-04-26 NOTE — Telephone Encounter (Signed)
New Message:     Please call, have some questions about how he should lay, when he he have Vertigo?Marland Kitchen

## 2021-04-29 ENCOUNTER — Other Ambulatory Visit: Payer: Self-pay

## 2021-04-29 ENCOUNTER — Ambulatory Visit (HOSPITAL_COMMUNITY): Payer: Medicare HMO | Attending: Cardiology

## 2021-04-29 DIAGNOSIS — I2510 Atherosclerotic heart disease of native coronary artery without angina pectoris: Secondary | ICD-10-CM | POA: Diagnosis not present

## 2021-04-29 DIAGNOSIS — R600 Localized edema: Secondary | ICD-10-CM | POA: Diagnosis not present

## 2021-04-29 LAB — ECHOCARDIOGRAM COMPLETE
Area-P 1/2: 2.58 cm2
S' Lateral: 3.6 cm

## 2021-05-12 DIAGNOSIS — I2510 Atherosclerotic heart disease of native coronary artery without angina pectoris: Secondary | ICD-10-CM | POA: Diagnosis not present

## 2021-05-12 DIAGNOSIS — R1032 Left lower quadrant pain: Secondary | ICD-10-CM | POA: Diagnosis not present

## 2021-05-12 DIAGNOSIS — K5792 Diverticulitis of intestine, part unspecified, without perforation or abscess without bleeding: Secondary | ICD-10-CM | POA: Diagnosis not present

## 2021-05-12 DIAGNOSIS — Z86010 Personal history of colonic polyps: Secondary | ICD-10-CM | POA: Diagnosis not present

## 2021-05-26 ENCOUNTER — Encounter: Payer: Self-pay | Admitting: Gastroenterology

## 2021-06-10 ENCOUNTER — Ambulatory Visit: Payer: Medicare HMO | Admitting: Gastroenterology

## 2021-06-10 ENCOUNTER — Encounter: Payer: Self-pay | Admitting: Gastroenterology

## 2021-06-10 VITALS — BP 134/70 | HR 60 | Ht 69.5 in | Wt 236.4 lb

## 2021-06-10 DIAGNOSIS — R1032 Left lower quadrant pain: Secondary | ICD-10-CM | POA: Diagnosis not present

## 2021-06-10 MED ORDER — METRONIDAZOLE 250 MG PO TABS: 250.0000 mg | ORAL_TABLET | Freq: Three times a day (TID) | ORAL | 0 refills | Status: DC

## 2021-06-10 NOTE — Progress Notes (Signed)
? ? ? ?06/10/2021 ?Wesley Johnson ?[id number] ?27-Jan-1941 ? ? ?HISTORY OF PRESENT ILLNESS: This is a 81 year old male who is a patient of Dr. Lynne Leader, not seen here in several years.  He is here today in regards to some left lower quadrant abdominal pain.  He says that it has been present for "a while" but was really been more bothersome over the past month.  He saw his PCP who placed him on Cipro and Flagyl, sounds like Cipro 500 mg twice daily and Flagyl 500 mg 3 times daily, but he says that he could only take it for 3 days because it made him feel not well (thinks it was the flagyl).  Says he does not like to take medication unless he absolutely needs to.  Does not like to go the doctor and have a bunch of tests done.  He takes prune juice and MiraLAX intermittently to help him move his bowels.  No rectal bleeding. ? ?Colonoscopy 08/2014:   ? ?1. Three sessile polyps in the ascending colon and at the cecum; polypectomies performed with a cold snare ?2. Mild diverticulosis in the sigmoid colon ?3. Grade l internal hemorrhoids ? ?Tubular adenomas. ? ? ?Past Medical History:  ?Diagnosis Date  ? Arthritis   ? BPH (benign prostatic hyperplasia)   ? CAD (coronary artery disease) 10/04/2010  ? s/p PCI of the Ramus, nonobstructive disease of LAD, LCx, and RCA  ? Diverticulosis   ? DJD (degenerative joint disease)   ? GERD (gastroesophageal reflux disease)   ? Glaucoma   ? Hemorrhoids   ? Hyperlipidemia   ? Hypertension   ? IBS (irritable bowel syndrome)   ? Impaired fasting glucose   ? Insomnia   ? Kidney stone   ? Lumbar stenosis   ? Metabolic syndrome   ? Tubular adenoma of colon 07/29/2005  ? ?Past Surgical History:  ?Procedure Laterality Date  ? CORONARY ANGIOPLASTY WITH STENT PLACEMENT  10/04/10  ? PCI Ramus with Promus DES  ? ? reports that he quit smoking about 45 years ago. His smoking use included cigarettes. He has never used smokeless tobacco. He reports that he does not drink alcohol and does not use  drugs. ?family history includes Bladder Cancer in his brother; Colon polyps in his daughter and son; Coronary artery disease in his maternal grandfather; Diabetes in his daughter and mother; High Cholesterol in his son; High blood pressure in his mother and son; Lung cancer in his father; Stroke in his mother. ?Allergies  ?Allergen Reactions  ? Amoxicillin-Pot Clavulanate   ?  Other reaction(s): stomach upset  ? Codeine   ?  Other reaction(s): sick on stomach  ? Ofloxacin Nausea And Vomiting  ? ? ?  ?Outpatient Encounter Medications as of 06/10/2021  ?Medication Sig  ? aspirin 81 MG chewable tablet Chew 81 mg by mouth daily.    ? ciprofloxacin (CIPRO) 250 MG tablet Take 250 mg by mouth as needed.  ? cyclobenzaprine (FLEXERIL) 10 MG tablet Take 1 tablet (10 mg total) by mouth 2 (two) times daily as needed for muscle spasms (do not use with other muscle relaxers such as methocarbomol).  ? diclofenac sodium (VOLTAREN) 1 % GEL Apply 2 g topically as needed.  ? esomeprazole (NEXIUM) 20 MG capsule Take 1 capsule by mouth daily.  ? finasteride (PROSCAR) 5 MG tablet Take 1 tablet (5 mg total) by mouth daily.  ? Hydrocortisone-Iodoquinol 1-1 % CREA as needed.  ? losartan (COZAAR) 100 MG tablet TAKE ONE  TABLET BY MOUTH EVERY DAY  ? nebivolol (BYSTOLIC) 10 MG tablet Take 1 tablet (10 mg total) by mouth daily.  ? nitroGLYCERIN (NITROSTAT) 0.4 MG SL tablet Place 1 tablet (0.4 mg total) under the tongue every 5 (five) minutes as needed for chest pain (MAX 3 TABLETS).  ? polyethylene glycol powder (GLYCOLAX/MIRALAX) powder Take 17 g by mouth daily as needed for mild constipation.   ? promethazine (PHENERGAN) 25 MG tablet Take 1 tablet (25 mg total) by mouth every 6 (six) hours as needed for nausea.  ? rosuvastatin (CRESTOR) 10 MG tablet Take 10 mg by mouth at bedtime.  ? tamsulosin (FLOMAX) 0.4 MG CAPS capsule Take 0.4 mg by mouth daily.  ? ?No facility-administered encounter medications on file as of 06/10/2021.  ? ? ? ?REVIEW OF  SYSTEMS  : All other systems reviewed and negative except where noted in the History of Present Illness. ? ? ?PHYSICAL EXAM: ?BP 134/70 (BP Location: Left Arm, Patient Position: Sitting, Cuff Size: Normal)   Pulse 60   Ht 5' 9.5" (1.765 m) Comment: measure without shoes  Wt 236 lb 6 oz (107.2 kg)   BMI 34.41 kg/m?  ?General: Well developed white male in no acute distress ?Head: Normocephalic and atraumatic ?Eyes:  Sclerae anicteric, conjunctiva pink. ?Ears: Normal auditory acuity ?Lungs: Clear throughout to auscultation; no W/R/R. ?Heart: Regular rate and rhythm; no M/R/G. ?Abdomen: Soft, non-distended.  BS present.  LLQ TTP. ?Musculoskeletal: Symmetrical with no gross deformities  ?Skin: No lesions on visible extremities ?Extremities: No edema  ?Neurological: Alert oriented x 4, grossly non-focal ?Psychological:  Alert and cooperative. Normal mood and affect ? ?ASSESSMENT AND PLAN: ?*Left lower quadrant abdominal pain: Did have some tenderness on exam today.  Says the pain has been there for a while, but worse over the past month.  Was treated with Cipro and Flagyl by his PCP empirically for diverticulitis, but says he only took the antibiotics for 3 days because of Flagyl seemed to make him not feel well.  He has Augmentin on his allergy list, listed as an intolerance that gives him an upset stomach.  He says he does not like to take medications unless absolutely necessary.  We discussed CT scan to evaluate before treating further.  He bounced back and forth and initially did not want to do antibiotics or CT scan and he said he was can see his urologist first to see if it was bladder related.  Then he agreed to try another course of antibiotics with a reduced dose of Flagyl.  He will do Cipro 500 mg twice daily and Flagyl 250 mg 3 times daily for 7 to 10 days.  I would like him to take a 10-day if he tolerates them okay.  Otherwise he will give [place] an update on how he is doing toward the end of his antibiotic  course to determine if his pain has resolved or is persist and whether we need to proceed with a CT scan, etc. We also discussed taking MiraLAX on a regular basis to be sure that constipation is not contributing to his pain. ? ? ?CC:  Ginger Organ., MD ? ?  ?

## 2021-06-10 NOTE — Patient Instructions (Signed)
Restart Cipro 2 tablets twice daily for 7-10 days. ? ?We have sent the following medications to your pharmacy for you to pick up at your convenience: ?Flagyl 250 mg three times daily for 10 days. ? ?Call or send message in 10 days with an update.  ? ?If you are age 78 or younger, your body mass index should be between 19-25. Your Body mass index is 24.28 kg/m?Marland Kitchen If this is out of the aformentioned range listed, please consider follow up with your Primary Care Provider.  ? ?________________________________________________________ ? ?The [facility] providers would like to encourage you to use [facility] to communicate with providers for non-urgent requests or questions.  Due to long hold times on the telephone, sending your provider a message by [facility] may be a faster and more efficient way to get a response.  Please allow 48 business hours for a response.  Please remember that this is for non-urgent requests.  ?_______________________________________________________ ?

## 2021-06-17 ENCOUNTER — Telehealth: Payer: Self-pay | Admitting: Gastroenterology

## 2021-06-17 NOTE — Telephone Encounter (Signed)
Called and spoke with patient. He knows to call back if pain returns. Pt verbalized understanding. ?

## 2021-06-17 NOTE — Telephone Encounter (Signed)
Returned call to patient. He states that he has finished about 7 days of Cipro and Flagyl. Pt states that he is feeling better and the pain has subsided right now. He plans to finish the full 10 day course. Pt had no other concerns at the end of the call.  ?

## 2021-06-17 NOTE — Telephone Encounter (Signed)
Patient called, states he was told to call our office to update [place] on the CIPRO medication. Please advise ?

## 2021-07-10 ENCOUNTER — Other Ambulatory Visit: Payer: Self-pay | Admitting: Urology

## 2021-08-09 DIAGNOSIS — R350 Frequency of micturition: Secondary | ICD-10-CM | POA: Diagnosis not present

## 2021-08-09 DIAGNOSIS — N401 Enlarged prostate with lower urinary tract symptoms: Secondary | ICD-10-CM | POA: Diagnosis not present

## 2021-08-09 DIAGNOSIS — N411 Chronic prostatitis: Secondary | ICD-10-CM | POA: Diagnosis not present

## 2021-08-09 DIAGNOSIS — R3912 Poor urinary stream: Secondary | ICD-10-CM | POA: Diagnosis not present

## 2021-09-18 NOTE — Progress Notes (Deleted)
Cardiology Office Note:    Date:  09/18/2021   ID:  Wesley Johnson, DOB [birth year], MRN [id number]  PCP:  Wesley Organ., MD   [facility] Providers Cardiologist:  Wesley Lean, MD     Referring MD: Wesley Organ., MD   CC: CAD f/u  History of Present Illness:    Wesley Johnson is a 81 y.o. male with a hx of CAD (prior Ramus PCI 2012), HLD, HTN who presents to establish care 04/14/2021.  Prior Allred/Lori Patient 2012-2016. 2023: wanted to be back on Bystolic. Echo WNL; did not want to do lasix.  Patient notes that he is doing ***.   Since last visit notes *** . There are no*** interval hospital/ED visit.    No chest pain or pressure ***.  No SOB/DOE*** and no PND/Orthopnea***.  No weight gain or leg swelling***.  No palpitations or syncope ***.  Ambulatory blood pressure ***.   Past Medical History:  Diagnosis Date   Arthritis    BPH (benign prostatic hyperplasia)    CAD (coronary artery disease) 10/04/2010   s/p PCI of the Ramus, nonobstructive disease of LAD, LCx, and RCA   Diverticulosis    DJD (degenerative joint disease)    GERD (gastroesophageal reflux disease)    Glaucoma    Hemorrhoids    Hyperlipidemia    Hypertension    IBS (irritable bowel syndrome)    Impaired fasting glucose    Insomnia    Kidney stone    Lumbar stenosis    Metabolic syndrome    Tubular adenoma of colon 07/29/2005    Past Surgical History:  Procedure Laterality Date   CORONARY ANGIOPLASTY WITH STENT PLACEMENT  10/04/10   PCI Ramus with Promus DES    Current Medications: No outpatient medications have been marked as taking for the 09/20/21 encounter (Appointment) with Wesley Lean, MD.     Allergies:   Amoxicillin-pot clavulanate, Codeine, and Ofloxacin   Social History   Socioeconomic History   Marital status: Widowed    Spouse name: Not on file   Number of children: 2   Years of education: Not on file   Highest education level: Not  on file  Occupational History   Occupation: Retired   Tobacco Use   Smoking status: Former    Types: Cigarettes    Quit date: 09/30/1975    Years since quitting: 46.0   Smokeless tobacco: Never   Tobacco comments:    quit in 1977 smoked 1.5 -2 ppd  Vaping Use   Vaping Use: Never used  Substance and Sexual Activity   Alcohol use: No    Alcohol/week: 0.0 standard drinks of alcohol   Drug use: No   Sexual activity: Yes  Other Topics Concern   Not on file  Social History Narrative   Lives in [place] with son.  Widowed.   Pt is retired from a [employer].   Social Determinants of Health   Financial Resource Strain: Not on file  Food Insecurity: Not on file  Transportation Needs: Not on file  Physical Activity: Not on file  Stress: Not on file  Social Connections: Not on file     Family History: The patient's family history includes Bladder Cancer in his brother; Colon polyps in his daughter and son; Coronary artery disease in his maternal grandfather; Diabetes in his daughter and mother; High Cholesterol in his son; High blood pressure in his mother and son; Lung cancer in his father;  Stroke in his mother.  ROS:   Please see the history of present illness.     All other systems reviewed and are negative.  EKGs/Labs/Other Studies Reviewed:    The following studies were reviewed today:  EKG:  EKG is  ordered today.  The ekg ordered today demonstrates  04/14/21: Sinus bradycardia 1st HB Q wave in III  Recent Labs: No results found for requested labs within last 365 days.  Recent Lipid Panel    Component Value Date/Time   CHOL 101 06/10/2013 0958   TRIG 151.0 (H) 06/10/2013 0958   HDL 24.70 (L) 06/10/2013 0958   CHOLHDL 4 06/10/2013 0958   VLDL 30.2 06/10/2013 0958   LDLCALC 46 06/10/2013 0958   LDLDIRECT 102.1 09/08/2009 1317        Physical Exam:    VS:  There were no vitals taken for this visit.    Wt Readings from Last 3 Encounters:  06/10/21 236 lb 6  oz (107.2 kg)  04/14/21 233 lb 4.8 oz (105.8 kg)  04/08/15 256 lb 4 oz (116.2 kg)    Gen: *** distress, *** obese/well nourished/malnourished   Neck: No JVD, *** carotid bruit Ears: *** Frank Sign Cardiac: No Rubs or Gallops, *** Murmur, ***cardia, *** radial pulses Respiratory: Clear to auscultation bilaterally, *** effort, ***  respiratory rate GI: Soft, nontender, non-distended *** MS: No *** edema; *** moves all extremities Integument: Skin feels *** Neuro:  At time of evaluation, alert and oriented to person/place/time/situation *** Psych: Normal affect, patient feels ***   ASSESSMENT:    No diagnosis found.  PLAN:    Coronary Artery Disease; Obstructive HTN HLD New LE edema - symptomatic off but on bystolic with stable angina*** - anatomy: obstructive ramus disease s/p PCI - continue ASA 81 mg - continue rosuvastatin, goal LDL < 55 (at goal 02/2021) - I offered coreg, but patient is adamant that his body handles certain medications differently; I expressed my concern that cost issues would bring up back to where he was prior; he understands the risk and would like to return back to bystolic 10 mg PO daily; this is reasonable so long as he understands the financial risk - continue losartan - given new LE edema (attributed to wearing his socks tight by patient)  One year me or APP   Medication Adjustments/Labs and Tests Ordered: Current medicines are reviewed at length with the patient today.  Concerns regarding medicines are outlined above.  No orders of the defined types were placed in this encounter.  No orders of the defined types were placed in this encounter.   There are no Patient Instructions on file for this visit.   Signed, Wesley Lean, MD  09/18/2021 4:40 PM    [facility]

## 2021-09-20 ENCOUNTER — Ambulatory Visit: Payer: Medicare HMO | Admitting: Internal Medicine

## 2021-12-04 DIAGNOSIS — Z23 Encounter for immunization: Secondary | ICD-10-CM | POA: Diagnosis not present

## 2022-02-16 DIAGNOSIS — H40013 Open angle with borderline findings, low risk, bilateral: Secondary | ICD-10-CM | POA: Diagnosis not present

## 2022-02-16 DIAGNOSIS — D3131 Benign neoplasm of right choroid: Secondary | ICD-10-CM | POA: Diagnosis not present

## 2022-02-16 DIAGNOSIS — H04201 Unspecified epiphora, right lacrimal gland: Secondary | ICD-10-CM | POA: Diagnosis not present

## 2022-02-16 DIAGNOSIS — H2513 Age-related nuclear cataract, bilateral: Secondary | ICD-10-CM | POA: Diagnosis not present

## 2022-02-16 DIAGNOSIS — H04123 Dry eye syndrome of bilateral lacrimal glands: Secondary | ICD-10-CM | POA: Diagnosis not present

## 2022-02-16 DIAGNOSIS — H43812 Vitreous degeneration, left eye: Secondary | ICD-10-CM | POA: Diagnosis not present

## 2022-02-23 DIAGNOSIS — Z0100 Encounter for examination of eyes and vision without abnormal findings: Secondary | ICD-10-CM | POA: Diagnosis not present

## 2022-03-14 DIAGNOSIS — Z125 Encounter for screening for malignant neoplasm of prostate: Secondary | ICD-10-CM | POA: Diagnosis not present

## 2022-03-14 DIAGNOSIS — I10 Essential (primary) hypertension: Secondary | ICD-10-CM | POA: Diagnosis not present

## 2022-03-14 DIAGNOSIS — R7301 Impaired fasting glucose: Secondary | ICD-10-CM | POA: Diagnosis not present

## 2022-03-14 DIAGNOSIS — R7989 Other specified abnormal findings of blood chemistry: Secondary | ICD-10-CM | POA: Diagnosis not present

## 2022-03-14 DIAGNOSIS — E785 Hyperlipidemia, unspecified: Secondary | ICD-10-CM | POA: Diagnosis not present

## 2022-03-21 DIAGNOSIS — Z1331 Encounter for screening for depression: Secondary | ICD-10-CM | POA: Diagnosis not present

## 2022-03-21 DIAGNOSIS — E8881 Metabolic syndrome: Secondary | ICD-10-CM | POA: Diagnosis not present

## 2022-03-21 DIAGNOSIS — E785 Hyperlipidemia, unspecified: Secondary | ICD-10-CM | POA: Diagnosis not present

## 2022-03-21 DIAGNOSIS — H8110 Benign paroxysmal vertigo, unspecified ear: Secondary | ICD-10-CM | POA: Diagnosis not present

## 2022-03-21 DIAGNOSIS — R7301 Impaired fasting glucose: Secondary | ICD-10-CM | POA: Diagnosis not present

## 2022-03-21 DIAGNOSIS — R82998 Other abnormal findings in urine: Secondary | ICD-10-CM | POA: Diagnosis not present

## 2022-03-21 DIAGNOSIS — Z1339 Encounter for screening examination for other mental health and behavioral disorders: Secondary | ICD-10-CM | POA: Diagnosis not present

## 2022-03-21 DIAGNOSIS — Z9861 Coronary angioplasty status: Secondary | ICD-10-CM | POA: Diagnosis not present

## 2022-03-21 DIAGNOSIS — I2510 Atherosclerotic heart disease of native coronary artery without angina pectoris: Secondary | ICD-10-CM | POA: Diagnosis not present

## 2022-03-21 DIAGNOSIS — I83813 Varicose veins of bilateral lower extremities with pain: Secondary | ICD-10-CM | POA: Diagnosis not present

## 2022-03-21 DIAGNOSIS — Z0000 Encounter for general adult medical examination without abnormal findings: Secondary | ICD-10-CM | POA: Diagnosis not present

## 2022-03-21 DIAGNOSIS — I10 Essential (primary) hypertension: Secondary | ICD-10-CM | POA: Diagnosis not present

## 2022-04-15 ENCOUNTER — Other Ambulatory Visit: Payer: Self-pay | Admitting: Internal Medicine

## 2022-04-21 ENCOUNTER — Telehealth: Payer: Self-pay | Admitting: Internal Medicine

## 2022-04-21 NOTE — Telephone Encounter (Signed)
Pt c/o medication issue:  1. Name of Medication: nebivolol (BYSTOLIC) 10 MG tablet   2. How are you currently taking this medication (dosage and times per day)?    3. Are you having a reaction (difficulty breathing--STAT)? no  4. What is your medication issue? Patient states that he suppose to get the name brand, not the generic medication. The generic cost more the stated. Please advise

## 2022-05-09 DIAGNOSIS — M5459 Other low back pain: Secondary | ICD-10-CM | POA: Diagnosis not present

## 2022-05-17 DIAGNOSIS — M4316 Spondylolisthesis, lumbar region: Secondary | ICD-10-CM | POA: Diagnosis not present

## 2022-05-17 DIAGNOSIS — M5136 Other intervertebral disc degeneration, lumbar region: Secondary | ICD-10-CM | POA: Diagnosis not present

## 2022-05-17 DIAGNOSIS — M48061 Spinal stenosis, lumbar region without neurogenic claudication: Secondary | ICD-10-CM | POA: Diagnosis not present

## 2022-05-17 DIAGNOSIS — M47816 Spondylosis without myelopathy or radiculopathy, lumbar region: Secondary | ICD-10-CM | POA: Diagnosis not present

## 2022-05-24 DIAGNOSIS — M5459 Other low back pain: Secondary | ICD-10-CM | POA: Diagnosis not present

## 2022-08-25 NOTE — Progress Notes (Unsigned)
Cardiology Office Note:    Date:  08/29/2022   ID:  Wesley Johnson, DOB 18-Jul-1940, MRN [id number]  PCP:  Wesley Polka., MD   [facility] Providers Cardiologist:  Wesley Constant, MD     Referring MD: Wesley Polka., MD   CC: Follow up vertigo  History of Present Illness:    Wesley Johnson is a 82 y.o. male with a hx of CAD (prior Ramus PCI 2012), HLD, HTN who presents to establish care 04/14/2021.   Prior Wesley Johnson 2012-2016. 2012: anginal equivalent CP walking around the graveyard. 2023: Felt best on bystolic.  Johnson notes that he is doing OK.   Since last visit notes that his vertigo has been going on that has been a lot worse. Not improved with meclizine There are no interval hospital/ED visit.   ECG shows SR with LVH.  No chest pain or pressure .  No SOB/DOE and no PND/Orthopnea.  No weight gain or leg swelling.  No palpitations.  Notes that he feels like the room is spinning.  Occurred every day for the past two weeks.    Past Medical History:  Diagnosis Date   Arthritis    BPH (benign prostatic hyperplasia)    CAD (coronary artery disease) 10/04/2010   s/p PCI of the Ramus, nonobstructive disease of LAD, LCx, and RCA   Diverticulosis    DJD (degenerative joint disease)    GERD (gastroesophageal reflux disease)    Glaucoma    Hemorrhoids    Hyperlipidemia    Hypertension    IBS (irritable bowel syndrome)    Impaired fasting glucose    Insomnia    Kidney stone    Lumbar stenosis    Metabolic syndrome    Tubular adenoma of colon 07/29/2005    Past Surgical History:  Procedure Laterality Date   CORONARY ANGIOPLASTY WITH STENT PLACEMENT  10/04/10   PCI Ramus with Promus DES    Current Medications: Current Meds  Medication Sig   aspirin 81 MG chewable tablet Chew 81 mg by mouth daily.     ciprofloxacin (CIPRO) 250 MG tablet Take 250 mg by mouth as needed.   cyclobenzaprine (FLEXERIL) 10 MG tablet Take 1 tablet (10 mg  total) by mouth 2 (two) times daily as needed for muscle spasms (do not use with other muscle relaxers such as methocarbomol).   diclofenac sodium (VOLTAREN) 1 % GEL Apply 2 g topically as needed.   esomeprazole (NEXIUM) 20 MG capsule Take 1 capsule by mouth daily.   finasteride (PROSCAR) 5 MG tablet Take 1 tablet (5 mg total) by mouth daily.   Hydrocortisone-Iodoquinol 1-1 % CREA as needed.   losartan (COZAAR) 100 MG tablet TAKE ONE TABLET BY MOUTH EVERY DAY   meclizine (ANTIVERT) 25 MG tablet as needed for dizziness.   nebivolol (BYSTOLIC) 10 MG tablet TAKE 1 TABLET EVERY DAY   polyethylene glycol powder (GLYCOLAX/MIRALAX) powder Take 17 g by mouth daily as needed for mild constipation.    promethazine (PHENERGAN) 25 MG tablet Take 1 tablet (25 mg total) by mouth every 6 (six) hours as needed for nausea.   rosuvastatin (CRESTOR) 10 MG tablet Take 10 mg by mouth at bedtime.   tamsulosin (FLOMAX) 0.4 MG CAPS capsule Take 0.4 mg by mouth daily.   [DISCONTINUED] nitroGLYCERIN (NITROSTAT) 0.4 MG SL tablet Place 1 tablet (0.4 mg total) under the tongue every 5 (five) minutes as needed for chest pain (MAX 3 TABLETS).  Allergies:   Amoxicillin-pot clavulanate, Codeine, and Ofloxacin   Social History   Socioeconomic History   Marital status: Widowed    Spouse name: Not on file   Number of children: 2   Years of education: Not on file   Highest education level: Not on file  Occupational History   Occupation: Retired   Tobacco Use   Smoking status: Former    Types: Cigarettes    Quit date: 09/30/1975    Years since quitting: 46.9   Smokeless tobacco: Never   Tobacco comments:    quit in 1977 smoked 1.5 -2 ppd  Vaping Use   Vaping Use: Never used  Substance and Sexual Activity   Alcohol use: No    Alcohol/week: 0.0 standard drinks of alcohol   Drug use: No   Sexual activity: Yes  Other Topics Concern   Not on file  Social History Narrative   Lives in [place] with son.  Widowed.    Pt is retired from a [employer].   Social Determinants of Health   Financial Resource Strain: Not on file  Food Insecurity: Not on file  Transportation Needs: Not on file  Physical Activity: Not on file  Stress: Not on file  Social Connections: Not on file     Family History: The Johnson's family history includes Bladder Cancer in his brother; Colon polyps in his daughter and son; Coronary artery disease in his maternal grandfather; Diabetes in his daughter and mother; High Cholesterol in his son; High blood pressure in his mother and son; Lung cancer in his father; Stroke in his mother.  ROS:   Please see the history of present illness.     EKGs/Labs/Other Studies Reviewed:    The following studies were reviewed today:  EKG:   04/14/21: Sinus bradycardia 1st HB Q wave in III  Cardiac Studies & Procedures       ECHOCARDIOGRAM  ECHOCARDIOGRAM COMPLETE 04/29/2021  Narrative ECHOCARDIOGRAM REPORT    Johnson Name:   Wesley Johnson Date of Exam: 04/29/2021 Medical Rec #:  [id number]         Height:       71.0 in Accession #:    [id number]        Weight:       233.3 lb Date of Birth:  03/23/1940         BSA:          2.251 m Johnson Age:    80 years          BP:           135/75 mmHg Johnson Gender: M                 HR:           62 bpm. Exam Location:  [address]  Procedure: 2D Echo, Cardiac Doppler and Color Doppler  Indications:    R60.0 Lower extremity edema; I25.10 Coronary artery disease;  History:        Johnson has no prior history of Echocardiogram examinations. Risk Factors:Hypertension and Dyslipidemia. Low back pain.  Sonographer:    Wesley Johnson RCS Referring Phys: [id number] [facility] Wesley Johnson  IMPRESSIONS   1. Left ventricular ejection fraction, by estimation, is 50%. The left ventricle has mildly decreased function. The left ventricle demonstrates global hypokinesis. There is mild left ventricular hypertrophy. Left ventricular diastolic  parameters are consistent with Grade I diastolic dysfunction (impaired relaxation). 2. Right ventricular systolic function is normal. The right ventricular  size is normal. Tricuspid regurgitation signal is inadequate for assessing PA pressure. 3. The mitral valve is normal in structure. Trivial mitral valve regurgitation. No evidence of mitral stenosis. 4. The aortic valve is tricuspid. There is mild calcification of the aortic valve. Aortic valve regurgitation is not visualized. Aortic valve sclerosis/calcification is present, without any evidence of aortic stenosis. 5. Aortic dilatation noted. There is mild dilatation of the ascending aorta, measuring 38 mm. 6. The inferior vena cava is normal in size with greater than 50% respiratory variability, suggesting right atrial pressure of 3 mmHg. 7. Technically difficult study with poor acoustic windows.  FINDINGS Left Ventricle: Left ventricular ejection fraction, by estimation, is 50%. The left ventricle has mildly decreased function. The left ventricle demonstrates global hypokinesis. The left ventricular internal cavity size was normal in size. There is mild left ventricular hypertrophy. Left ventricular diastolic parameters are consistent with Grade I diastolic dysfunction (impaired relaxation).  Right Ventricle: The right ventricular size is normal. No increase in right ventricular wall thickness. Right ventricular systolic function is normal. Tricuspid regurgitation signal is inadequate for assessing PA pressure.  Left Atrium: Left atrial size was normal in size.  Right Atrium: Right atrial size was normal in size.  Pericardium: There is no evidence of pericardial effusion.  Mitral Valve: The mitral valve is normal in structure. There is mild calcification of the mitral valve leaflet(s). Mild mitral annular calcification. Trivial mitral valve regurgitation. No evidence of mitral valve stenosis.  Tricuspid Valve: The tricuspid valve is  normal in structure. Tricuspid valve regurgitation is not demonstrated.  Aortic Valve: The aortic valve is tricuspid. There is mild calcification of the aortic valve. Aortic valve regurgitation is not visualized. Aortic valve sclerosis/calcification is present, without any evidence of aortic stenosis.  Pulmonic Valve: The pulmonic valve was normal in structure. Pulmonic valve regurgitation is not visualized.  Aorta: The aortic root is normal in size and structure and aortic dilatation noted. There is mild dilatation of the ascending aorta, measuring 38 mm.  Venous: The inferior vena cava is normal in size with greater than 50% respiratory variability, suggesting right atrial pressure of 3 mmHg.  IAS/Shunts: No atrial level shunt detected by color flow Doppler.   LEFT VENTRICLE PLAX 2D LVIDd:         4.60 cm   Diastology LVIDs:         3.60 cm   LV e' medial:    6.65 cm/s LV PW:         1.20 cm   LV E/e' medial:  8.6 LV IVS:        1.20 cm   LV e' lateral:   8.35 cm/s LVOT diam:     2.05 cm   LV E/e' lateral: 6.8 LV SV:         90 LV SV Index:   40 LVOT Area:     3.30 cm   RIGHT VENTRICLE RV Basal diam:  2.20 cm RV S prime:     10.50 cm/s TAPSE (M-mode): 1.7 cm  LEFT ATRIUM             Index        RIGHT ATRIUM           Index LA diam:        3.10 cm 1.38 cm/m   RA Area:     11.30 cm LA Vol (A2C):   36.2 ml 16.08 ml/m  RA Volume:   19.30 ml  8.57 ml/m LA Vol (  A4C):   32.9 ml 14.61 ml/m LA Biplane Vol: 35.7 ml 15.86 ml/m AORTIC VALVE LVOT Vmax:   122.00 cm/s LVOT Vmean:  77.300 cm/s LVOT VTI:    0.274 m  AORTA Ao Root diam: 3.20 cm  MITRAL VALVE MV Area (PHT): 2.58 cm     SHUNTS MV Decel Time: 294 msec     Systemic VTI:  0.27 m MV E velocity: 57.10 cm/s   Systemic Diam: 2.05 cm MV A velocity: 101.00 cm/s MV E/A ratio:  0.57  Dalton McleanMD Electronically signed by Wilfred Lacy Signature Date/Time: 04/29/2021/4:19:34 PM    Final             Recent  Labs: No results found for requested labs within last 365 days.  Recent Lipid Panel    Component Value Date/Time   CHOL 101 06/10/2013 0958   TRIG 151.0 (H) 06/10/2013 0958   HDL 24.70 (L) 06/10/2013 0958   CHOLHDL 4 06/10/2013 0958   VLDL 30.2 06/10/2013 0958   LDLCALC 46 06/10/2013 0958   LDLDIRECT 102.1 09/08/2009 1317        Physical Exam:    VS:  BP 124/62   Pulse 68   Ht 6' (1.829 m)   Wt 234 lb (106.1 kg)   SpO2 96%   BMI 31.74 kg/m     Wt Readings from Last 3 Encounters:  08/29/22 234 lb (106.1 kg)  06/10/21 236 lb 6 oz (107.2 kg)  04/14/21 233 lb 4.8 oz (105.8 kg)    Gen: no distress   Neck: No JVD Cardiac: No Rubs or Gallops, soft murmur, RRR +2 radial pulses Respiratory: Clear to auscultation bilaterally, normal effort, normal  respiratory rate GI: Soft, nontender, non-distended  MS: No edema;  moves all extremities Integument: Skin feels warm Neuro:  At time of evaluation, alert and oriented to person/place/time/situation Psych: Normal affect, Johnson feels OK  ASSESSMENT:    1. CAD in native artery   2. Benign paroxysmal positional vertigo due to bilateral vestibular disorder   3. Essential hypertension   4. Mixed hyperlipidemia     PLAN:     NPPV - principally managed by PCP - we have discussed low sodium diet - we have given given him exercises - he has declined vestibular rehab  Coronary Artery Disease; Obstructive HLD - anatomy: obstructive ramus disease s/p PCI - continue ASA 81 mg - continue rosuvastatin, goal LDL  HTN - Only able to tolerate Bystolic 10 mg; has nausea with generic formulation or other beta blockers - continue losartan  1st heart block - monitor  One year with team   Medication Adjustments/Labs and Tests Ordered: Current medicines are reviewed at length with the Johnson today.  Concerns regarding medicines are outlined above.  Orders Placed This Encounter  Procedures   EKG 12-Lead   Meds ordered this  encounter  Medications   nitroGLYCERIN (NITROSTAT) 0.4 MG SL tablet    Sig: Place 1 tablet (0.4 mg total) under the tongue every 5 (five) minutes as needed for chest pain (MAX 3 TABLETS).    Dispense:  25 tablet    Refill:  2    Johnson Instructions  Medication Instructions:  Your physician recommends that you continue on your current medications as directed. Please refer to the Current Medication list given to you today.  *If you need a refill on your cardiac medications before your next appointment, please call your pharmacy*   Lab Work: NONE If you have labs (blood work) drawn today  and your tests are completely normal, you will receive your results only by: MyChart Message (if you have MyChart) OR A paper copy in the mail If you have any lab test that is abnormal or we need to change your treatment, we will call you to review the results.   Testing/Procedures: NONE   Follow-Up: At [facility], you and your health needs are our priority.  As part of our continuing mission to provide you with exceptional heart care, we have created designated Provider Care Teams.  These Care Teams include your primary Cardiologist (physician) and Advanced Practice Providers (APPs -  Physician Assistants and Nurse Practitioners) who all work together to provide you with the care you need, when you need it.  We recommend signing up for the Johnson portal called "MyChart".  Sign up information is provided on this After Visit Summary.  MyChart is used to connect with patients for Virtual Visits (Telemedicine).  Patients are able to view lab/test results, encounter notes, upcoming appointments, etc.  Non-urgent messages can be sent to your provider as well.   To learn more about what you can do with MyChart, go to [email or web address].    Your next appointment:   1 year(s)  Provider:   Riley Lam, MD or Herma Carson, PA or Tereso Newcomer, [place]       Signed, Wesley Constant, MD  08/29/2022 2:50 PM    [facility]

## 2022-08-29 ENCOUNTER — Ambulatory Visit: Payer: Medicare HMO | Attending: Internal Medicine | Admitting: Internal Medicine

## 2022-08-29 ENCOUNTER — Encounter: Payer: Self-pay | Admitting: Internal Medicine

## 2022-08-29 VITALS — BP 124/62 | HR 68 | Ht 72.0 in | Wt 234.0 lb

## 2022-08-29 DIAGNOSIS — E782 Mixed hyperlipidemia: Secondary | ICD-10-CM

## 2022-08-29 DIAGNOSIS — H8113 Benign paroxysmal vertigo, bilateral: Secondary | ICD-10-CM | POA: Diagnosis not present

## 2022-08-29 DIAGNOSIS — I10 Essential (primary) hypertension: Secondary | ICD-10-CM

## 2022-08-29 DIAGNOSIS — I2510 Atherosclerotic heart disease of native coronary artery without angina pectoris: Secondary | ICD-10-CM | POA: Diagnosis not present

## 2022-08-29 MED ORDER — NITROGLYCERIN 0.4 MG SL SUBL: 0.4000 mg | SUBLINGUAL_TABLET | SUBLINGUAL | 2 refills | Status: AC | PRN

## 2022-08-29 NOTE — Patient Instructions (Addendum)
Medication Instructions:  Your physician recommends that you continue on your current medications as directed. Please refer to the Current Medication list given to you today.  *If you need a refill on your cardiac medications before your next appointment, please call your pharmacy*   Lab Work: NONE If you have labs (blood work) drawn today and your tests are completely normal, you will receive your results only by: MyChart Message (if you have MyChart) OR A paper copy in the mail If you have any lab test that is abnormal or we need to change your treatment, we will call you to review the results.   Testing/Procedures: NONE   Follow-Up: At [facility], you and your health needs are our priority.  As part of our continuing mission to provide you with exceptional heart care, we have created designated Provider Care Teams.  These Care Teams include your primary Cardiologist (physician) and Advanced Practice Providers (APPs -  Physician Assistants and Nurse Practitioners) who all work together to provide you with the care you need, when you need it.  We recommend signing up for the patient portal called "MyChart".  Sign up information is provided on this After Visit Summary.  MyChart is used to connect with patients for Virtual Visits (Telemedicine).  Patients are able to view lab/test results, encounter notes, upcoming appointments, etc.  Non-urgent messages can be sent to your provider as well.   To learn more about what you can do with MyChart, go to [email or web address].    Your next appointment:   1 year(s)  Provider:   Riley Lam, MD or Herma Carson, PA or Tereso Newcomer, [place]

## 2022-08-31 DIAGNOSIS — N401 Enlarged prostate with lower urinary tract symptoms: Secondary | ICD-10-CM | POA: Diagnosis not present

## 2022-08-31 DIAGNOSIS — N411 Chronic prostatitis: Secondary | ICD-10-CM | POA: Diagnosis not present

## 2022-08-31 DIAGNOSIS — R350 Frequency of micturition: Secondary | ICD-10-CM | POA: Diagnosis not present

## 2022-12-03 DIAGNOSIS — Z23 Encounter for immunization: Secondary | ICD-10-CM | POA: Diagnosis not present

## 2023-02-15 DIAGNOSIS — R102 Pelvic and perineal pain: Secondary | ICD-10-CM | POA: Diagnosis not present

## 2023-02-15 DIAGNOSIS — R1084 Generalized abdominal pain: Secondary | ICD-10-CM | POA: Diagnosis not present

## 2023-02-15 DIAGNOSIS — I10 Essential (primary) hypertension: Secondary | ICD-10-CM | POA: Diagnosis not present

## 2023-02-15 DIAGNOSIS — K5900 Constipation, unspecified: Secondary | ICD-10-CM | POA: Diagnosis not present

## 2023-03-02 DIAGNOSIS — H04201 Unspecified epiphora, right lacrimal gland: Secondary | ICD-10-CM | POA: Diagnosis not present

## 2023-03-02 DIAGNOSIS — D3131 Benign neoplasm of right choroid: Secondary | ICD-10-CM | POA: Diagnosis not present

## 2023-03-02 DIAGNOSIS — H43812 Vitreous degeneration, left eye: Secondary | ICD-10-CM | POA: Diagnosis not present

## 2023-03-02 DIAGNOSIS — H04123 Dry eye syndrome of bilateral lacrimal glands: Secondary | ICD-10-CM | POA: Diagnosis not present

## 2023-03-02 DIAGNOSIS — H2513 Age-related nuclear cataract, bilateral: Secondary | ICD-10-CM | POA: Diagnosis not present

## 2023-03-02 DIAGNOSIS — H40013 Open angle with borderline findings, low risk, bilateral: Secondary | ICD-10-CM | POA: Diagnosis not present

## 2023-03-24 DIAGNOSIS — E785 Hyperlipidemia, unspecified: Secondary | ICD-10-CM | POA: Diagnosis not present

## 2023-03-24 DIAGNOSIS — Z125 Encounter for screening for malignant neoplasm of prostate: Secondary | ICD-10-CM | POA: Diagnosis not present

## 2023-03-24 DIAGNOSIS — I10 Essential (primary) hypertension: Secondary | ICD-10-CM | POA: Diagnosis not present

## 2023-03-24 DIAGNOSIS — I2510 Atherosclerotic heart disease of native coronary artery without angina pectoris: Secondary | ICD-10-CM | POA: Diagnosis not present

## 2023-03-31 DIAGNOSIS — E8881 Metabolic syndrome: Secondary | ICD-10-CM | POA: Diagnosis not present

## 2023-03-31 DIAGNOSIS — R82998 Other abnormal findings in urine: Secondary | ICD-10-CM | POA: Diagnosis not present

## 2023-03-31 DIAGNOSIS — K5909 Other constipation: Secondary | ICD-10-CM | POA: Diagnosis not present

## 2023-03-31 DIAGNOSIS — Z1339 Encounter for screening examination for other mental health and behavioral disorders: Secondary | ICD-10-CM | POA: Diagnosis not present

## 2023-03-31 DIAGNOSIS — Z0000 Encounter for general adult medical examination without abnormal findings: Secondary | ICD-10-CM | POA: Diagnosis not present

## 2023-03-31 DIAGNOSIS — I10 Essential (primary) hypertension: Secondary | ICD-10-CM | POA: Diagnosis not present

## 2023-03-31 DIAGNOSIS — R7301 Impaired fasting glucose: Secondary | ICD-10-CM | POA: Diagnosis not present

## 2023-03-31 DIAGNOSIS — Z9861 Coronary angioplasty status: Secondary | ICD-10-CM | POA: Diagnosis not present

## 2023-03-31 DIAGNOSIS — E785 Hyperlipidemia, unspecified: Secondary | ICD-10-CM | POA: Diagnosis not present

## 2023-03-31 DIAGNOSIS — Z1331 Encounter for screening for depression: Secondary | ICD-10-CM | POA: Diagnosis not present

## 2023-03-31 DIAGNOSIS — I2510 Atherosclerotic heart disease of native coronary artery without angina pectoris: Secondary | ICD-10-CM | POA: Diagnosis not present

## 2023-05-17 DIAGNOSIS — L729 Follicular cyst of the skin and subcutaneous tissue, unspecified: Secondary | ICD-10-CM | POA: Diagnosis not present

## 2023-05-17 DIAGNOSIS — L089 Local infection of the skin and subcutaneous tissue, unspecified: Secondary | ICD-10-CM | POA: Diagnosis not present

## 2023-06-14 DIAGNOSIS — M545 Low back pain, unspecified: Secondary | ICD-10-CM | POA: Diagnosis not present

## 2023-06-14 DIAGNOSIS — K5790 Diverticulosis of intestine, part unspecified, without perforation or abscess without bleeding: Secondary | ICD-10-CM | POA: Diagnosis not present

## 2023-06-14 DIAGNOSIS — K5909 Other constipation: Secondary | ICD-10-CM | POA: Diagnosis not present

## 2023-06-14 DIAGNOSIS — R1032 Left lower quadrant pain: Secondary | ICD-10-CM | POA: Diagnosis not present

## 2023-06-14 DIAGNOSIS — I10 Essential (primary) hypertension: Secondary | ICD-10-CM | POA: Diagnosis not present

## 2023-07-13 ENCOUNTER — Encounter (HOSPITAL_BASED_OUTPATIENT_CLINIC_OR_DEPARTMENT_OTHER): Payer: Self-pay

## 2023-07-13 ENCOUNTER — Encounter (HOSPITAL_COMMUNITY): Payer: Self-pay | Admitting: Internal Medicine

## 2023-07-13 ENCOUNTER — Ambulatory Visit (HOSPITAL_BASED_OUTPATIENT_CLINIC_OR_DEPARTMENT_OTHER)
Admission: RE | Admit: 2023-07-13 | Discharge: 2023-07-13 | Disposition: A | Discharge status: Home / Self Care | Source: Ambulatory Visit | Attending: Internal Medicine | Admitting: Internal Medicine

## 2023-07-13 ENCOUNTER — Other Ambulatory Visit

## 2023-07-13 ENCOUNTER — Other Ambulatory Visit (HOSPITAL_COMMUNITY): Payer: Self-pay | Admitting: Internal Medicine

## 2023-07-13 DIAGNOSIS — N400 Enlarged prostate without lower urinary tract symptoms: Secondary | ICD-10-CM | POA: Diagnosis not present

## 2023-07-13 DIAGNOSIS — R1011 Right upper quadrant pain: Secondary | ICD-10-CM | POA: Diagnosis not present

## 2023-07-13 DIAGNOSIS — R109 Unspecified abdominal pain: Secondary | ICD-10-CM | POA: Diagnosis not present

## 2023-07-13 DIAGNOSIS — N281 Cyst of kidney, acquired: Secondary | ICD-10-CM | POA: Diagnosis not present

## 2023-07-13 DIAGNOSIS — R1030 Lower abdominal pain, unspecified: Secondary | ICD-10-CM

## 2023-07-13 DIAGNOSIS — R112 Nausea with vomiting, unspecified: Secondary | ICD-10-CM | POA: Diagnosis not present

## 2023-07-13 DIAGNOSIS — K8020 Calculus of gallbladder without cholecystitis without obstruction: Secondary | ICD-10-CM | POA: Diagnosis not present

## 2023-07-13 DIAGNOSIS — D72829 Elevated white blood cell count, unspecified: Secondary | ICD-10-CM | POA: Diagnosis not present

## 2023-07-13 DIAGNOSIS — I10 Essential (primary) hypertension: Secondary | ICD-10-CM | POA: Diagnosis not present

## 2023-07-13 DIAGNOSIS — N390 Urinary tract infection, site not specified: Secondary | ICD-10-CM | POA: Diagnosis not present

## 2023-07-13 DIAGNOSIS — R8290 Unspecified abnormal findings in urine: Secondary | ICD-10-CM | POA: Diagnosis not present

## 2023-07-13 DIAGNOSIS — N132 Hydronephrosis with renal and ureteral calculous obstruction: Secondary | ICD-10-CM | POA: Diagnosis not present

## 2023-07-13 DIAGNOSIS — K5909 Other constipation: Secondary | ICD-10-CM | POA: Diagnosis not present

## 2023-07-13 MED ORDER — IOHEXOL 300 MG/ML  SOLN
100.0000 mL | Freq: Once | INTRAMUSCULAR | Status: AC | PRN
  Administered 2023-07-13: 100 mL via INTRAVENOUS

## 2023-07-16 ENCOUNTER — Emergency Department (HOSPITAL_COMMUNITY)

## 2023-07-16 ENCOUNTER — Emergency Department (HOSPITAL_COMMUNITY)
Admission: EM | Admit: 2023-07-16 | Discharge: 2023-07-16 | Disposition: A | Discharge status: Home / Self Care | Attending: Emergency Medicine | Admitting: Emergency Medicine

## 2023-07-16 ENCOUNTER — Encounter (HOSPITAL_COMMUNITY): Payer: Self-pay

## 2023-07-16 ENCOUNTER — Other Ambulatory Visit: Payer: Self-pay

## 2023-07-16 DIAGNOSIS — D72829 Elevated white blood cell count, unspecified: Secondary | ICD-10-CM | POA: Diagnosis not present

## 2023-07-16 DIAGNOSIS — R1031 Right lower quadrant pain: Secondary | ICD-10-CM | POA: Diagnosis not present

## 2023-07-16 DIAGNOSIS — I10 Essential (primary) hypertension: Secondary | ICD-10-CM | POA: Diagnosis not present

## 2023-07-16 DIAGNOSIS — K5900 Constipation, unspecified: Secondary | ICD-10-CM | POA: Diagnosis not present

## 2023-07-16 DIAGNOSIS — R109 Unspecified abdominal pain: Secondary | ICD-10-CM

## 2023-07-16 DIAGNOSIS — Z79899 Other long term (current) drug therapy: Secondary | ICD-10-CM | POA: Diagnosis not present

## 2023-07-16 DIAGNOSIS — Z7982 Long term (current) use of aspirin: Secondary | ICD-10-CM | POA: Diagnosis not present

## 2023-07-16 LAB — COMPREHENSIVE METABOLIC PANEL WITH GFR
ALT: UNDETERMINED U/L (ref 0–44)
AST: 36 U/L (ref 15–41)
Albumin: 3.8 g/dL (ref 3.5–5.0)
Alkaline Phosphatase: 60 U/L (ref 38–126)
Anion gap: 14 (ref 5–15)
BUN: 27 mg/dL — ABNORMAL HIGH (ref 8–23)
CO2: 18 mmol/L — ABNORMAL LOW (ref 22–32)
Calcium: 8.9 mg/dL (ref 8.9–10.3)
Chloride: 98 mmol/L (ref 98–111)
Creatinine, Ser: 1.71 mg/dL — ABNORMAL HIGH (ref 0.61–1.24)
GFR, Estimated: 40 mL/min — ABNORMAL LOW (ref >60)
Glucose, Bld: 133 mg/dL — ABNORMAL HIGH (ref 70–99)
Potassium: 5.3 mmol/L — ABNORMAL HIGH (ref 3.5–5.1)
Sodium: 130 mmol/L — ABNORMAL LOW (ref 135–145)
Total Bilirubin: 2.8 mg/dL — ABNORMAL HIGH (ref 0.0–1.2)
Total Protein: 7.1 g/dL (ref 6.5–8.1)

## 2023-07-16 LAB — CBC
HCT: 48.2 % (ref 39.0–52.0)
Hemoglobin: 16.6 g/dL (ref 13.0–17.0)
MCH: 32.1 pg (ref 26.0–34.0)
MCHC: 34.4 g/dL (ref 30.0–36.0)
MCV: 93.2 fL (ref 80.0–100.0)
Platelets: 175 10*3/uL (ref 150–400)
RBC: 5.17 MIL/uL (ref 4.22–5.81)
RDW: 12.3 % (ref 11.5–15.5)
WBC: 12.1 10*3/uL — ABNORMAL HIGH (ref 4.0–10.5)
nRBC: 0 % (ref 0.0–0.2)

## 2023-07-16 LAB — URINALYSIS, ROUTINE W REFLEX MICROSCOPIC
Bilirubin Urine: NEGATIVE
Glucose, UA: NEGATIVE mg/dL
Ketones, ur: 5 mg/dL — AB
Nitrite: NEGATIVE
Protein, ur: NEGATIVE mg/dL
RBC / HPF: >50 RBC/hpf (ref 0–5)
Specific Gravity, Urine: 1.021 (ref 1.005–1.030)
pH: 5 (ref 5.0–8.0)

## 2023-07-16 MED ORDER — FENTANYL CITRATE PF 50 MCG/ML IJ SOSY
50.0000 ug | PREFILLED_SYRINGE | Freq: Once | INTRAMUSCULAR | Status: AC
  Administered 2023-07-16: 50 ug via INTRAVENOUS
  Filled 2023-07-16: qty 1

## 2023-07-16 MED ORDER — ONDANSETRON HCL 4 MG/2ML IJ SOLN
4.0000 mg | Freq: Once | INTRAMUSCULAR | Status: AC
  Administered 2023-07-16: 4 mg via INTRAVENOUS

## 2023-07-16 MED ORDER — KETOROLAC TROMETHAMINE 15 MG/ML IJ SOLN
15.0000 mg | Freq: Once | INTRAMUSCULAR | Status: AC
  Administered 2023-07-16: 15 mg via INTRAVENOUS
  Filled 2023-07-16: qty 1

## 2023-07-16 MED ORDER — SENNOSIDES-DOCUSATE SODIUM 8.6-50 MG PO TABS: 1.0000 | ORAL_TABLET | Freq: Every day | ORAL | 0 refills | Status: AC

## 2023-07-16 MED ORDER — OXYCODONE-ACETAMINOPHEN 5-325 MG PO TABS
1.0000 | ORAL_TABLET | Freq: Four times a day (QID) | ORAL | 0 refills | Status: DC | PRN
Start: 2023-07-16 — End: 2023-07-20

## 2023-07-16 MED ORDER — ONDANSETRON HCL 4 MG/2ML IJ SOLN
4.0000 mg | Freq: Once | INTRAMUSCULAR | Status: DC
  Filled 2023-07-16: qty 2

## 2023-07-16 NOTE — Discharge Instructions (Addendum)
 You were given medication in order to help with your pain, please take 1 Percocet every 6 hours to help with worsening pain.  In addition you will need to start a regimen of docusate, this medication will help with future bowel movements.

## 2023-07-16 NOTE — ED Provider Notes (Signed)
 [place] EMERGENCY DEPARTMENT AT [facility] Provider Note   CSN: [id number] Arrival date & time: 07/16/23  4098     History HTN Chief Complaint  Patient presents with   Kidney Stones   Nausea   Emesis   Constipation    Wesley Johnson is a 83 y.o. male.  83 year old male with a past medical history of hypertension presents to the ED with a chief complaint of right flank pain that is been ongoing for the past 4 days.  Patient reports he was diagnosed with a kidney stone 4 days ago was in the hospital, sent home on tramadol reports there has not been any improvement in symptoms.  He is scheduled to see a urologist tomorrow in office.  Patient states that the pain is sharp stabbing from the right flank radiating into the right lower quadrant.  He also reports he has not had a bowel movement in the past 4 days, unsure whether he is passing gas or not.  Son at the bedside reports patient was too uncomfortable today to wait 1 more day.  Denies any fever, chills, chest pain or shortness of breath.  The history is provided by the patient.  Emesis Associated symptoms: no chills and no fever   Constipation Associated symptoms: vomiting   Associated symptoms: no fever        Home Medications Prior to Admission medications   Medication Sig Start Date End Date Taking? Authorizing Provider  oxyCODONE-acetaminophen (PERCOCET/ROXICET) 5-325 MG tablet Take 1 tablet by mouth every 6 (six) hours as needed for up to 3 days for severe pain (pain score 7-10). 07/16/23 07/19/23 Yes Alma Muegge, PA-C  senna-docusate (SENOKOT-S) 8.6-50 MG tablet Take 1 tablet by mouth daily for 5 days. 07/16/23 07/21/23 Yes Devlin Brink, PA-C  aspirin 81 MG chewable tablet Chew 81 mg by mouth daily.      [provider]  ciprofloxacin  (CIPRO ) 250 MG tablet Take 250 mg by mouth as needed. 02/26/21   [provider]  cyclobenzaprine  (FLEXERIL ) 10 MG tablet Take 1 tablet (10 mg total) by  mouth 2 (two) times daily as needed for muscle spasms (do not use with other muscle relaxers such as methocarbomol). 07/19/13   Maurie Southern, DO  diclofenac  sodium (VOLTAREN ) 1 % GEL Apply 2 g topically as needed.    [provider]  esomeprazole (NEXIUM) 20 MG capsule Take 1 capsule by mouth daily. 10/30/19   [provider]  finasteride  (PROSCAR ) 5 MG tablet Take 1 tablet (5 mg total) by mouth daily. 03/05/13   Janece Means, MD  Hydrocortisone-Iodoquinol 1-1 % CREA as needed.    [provider]  losartan  (COZAAR ) 100 MG tablet TAKE ONE TABLET BY MOUTH EVERY DAY 03/05/13   Jenkins, John E, MD  meclizine (ANTIVERT) 25 MG tablet as needed for dizziness. 07/09/21   [provider]  nebivolol  (BYSTOLIC ) 10 MG tablet TAKE 1 TABLET EVERY DAY 04/15/22   Chandrasekhar, Mahesh A, MD  nitroGLYCERIN  (NITROSTAT ) 0.4 MG SL tablet Place 1 tablet (0.4 mg total) under the tongue every 5 (five) minutes as needed for chest pain (MAX 3 TABLETS). 08/29/22   Chandrasekhar, Arneta Beverage A, MD  polyethylene glycol powder (GLYCOLAX /MIRALAX ) powder Take 17 g by mouth daily as needed for mild constipation.     Asencion Blacksmith, MD  promethazine  (PHENERGAN ) 25 MG tablet Take 1 tablet (25 mg total) by mouth every 6 (six) hours as needed for nausea. 05/01/13   Corita Diego  A, MD  rosuvastatin (CRESTOR) 10 MG tablet Take 10 mg by mouth at bedtime. 04/03/21   [provider]  tamsulosin (FLOMAX) 0.4 MG CAPS capsule Take 0.4 mg by mouth daily. 02/14/21   [provider]      Allergies    Amoxicillin-pot clavulanate, Codeine, and Ofloxacin    Review of Systems   Review of Systems  Constitutional:  Negative for chills and fever.  Respiratory:  Negative for shortness of breath.   Cardiovascular:  Negative for chest pain.  Gastrointestinal:  Positive for constipation and vomiting.  Genitourinary:  Positive for flank pain.  All other systems reviewed and are negative.   Physical  Exam Updated Vital Signs BP (!) 140/72   Pulse 75   Temp 98 F (36.7 C)   Resp 16   Ht 6' (1.829 m)   Wt 103.9 kg   SpO2 94%   BMI 31.06 kg/m  Physical Exam Vitals and nursing note reviewed.  Constitutional:      Appearance: He is well-developed.     Comments: Appears uncomfortable.  HENT:     Head: Normocephalic and atraumatic.  Eyes:     General: No scleral icterus.    Pupils: Pupils are equal, round, and reactive to light.  Cardiovascular:     Heart sounds: Normal heart sounds.  Pulmonary:     Effort: Pulmonary effort is normal.     Breath sounds: Normal breath sounds. No wheezing.  Chest:     Chest wall: No tenderness.  Abdominal:     General: Bowel sounds are normal. There is no distension.     Palpations: Abdomen is soft.     Tenderness: There is no abdominal tenderness. There is right CVA tenderness.  Musculoskeletal:        General: No tenderness or deformity.     Cervical back: Normal range of motion.  Skin:    General: Skin is warm and dry.  Neurological:     Mental Status: He is alert and oriented to person, place, and time.     ED Results / Procedures / Treatments   Labs (all labs ordered are listed, but only abnormal results are displayed) Labs Reviewed  COMPREHENSIVE METABOLIC PANEL WITH GFR - Abnormal; Notable for the following components:      Result Value   Sodium 130 (*)    Potassium 5.3 (*)    CO2 18 (*)    Glucose, Bld 133 (*)    BUN 27 (*)    Creatinine, Ser 1.71 (*)    Total Bilirubin 2.8 (*)    GFR, Estimated 40 (*)    All other components within normal limits  CBC - Abnormal; Notable for the following components:   WBC 12.1 (*)    All other components within normal limits  URINALYSIS, ROUTINE W REFLEX MICROSCOPIC - Abnormal; Notable for the following components:   APPearance HAZY (*)    Hgb urine dipstick LARGE (*)    Ketones, ur 5 (*)    Leukocytes,Ua TRACE (*)    Bacteria, UA RARE (*)    All other components within normal  limits  LIPASE, BLOOD    EKG None  Radiology No results found.  Procedures Procedures    Medications Ordered in ED Medications  ondansetron (ZOFRAN) injection 4 mg (4 mg Intravenous Not Given 07/16/23 1341)  fentaNYL (SUBLIMAZE) injection 50 mcg (50 mcg Intravenous Given 07/16/23 1228)  ketorolac (TORADOL) 15 MG/ML injection 15 mg (15 mg Intravenous Given 07/16/23 1314)  ondansetron (  ZOFRAN) injection 4 mg (4 mg Intravenous Given 07/16/23 1313)    ED Course/ Medical Decision Making/ A&P Clinical Course as of 07/16/23 1513  Sun Jul 16, 2023  1205 Potassium(!): 5.3 [JS]  1205 Creatinine(!): 1.71 Worsen from  [JS]  1414 Hgb urine dipstick(!): LARGE [JS]  1420 WBC, UA: 11-20 [JS]    Clinical Course User Index [JS] Derin Matthes, PA-C                                 Medical Decision Making Amount and/or Complexity of Data Reviewed Labs: ordered. Decision-making details documented in ED Course. Radiology: ordered.  Risk OTC drugs. Prescription drug management.    This patient presents to the ED for concern of right flank pain, this involves a number of treatment options, and is a complaint that carries with it a high risk of complications and morbidity.  The differential diagnosis includes renal colic, pyelonephritis versus obstruction.    Co morbidities: Discussed in HPI   Brief History:  See HPI.   EMR reviewed including pt PMHx, past surgical history and past visits to ER.   See HPI for more details   Lab Tests:  I ordered and independently interpreted labs.  The pertinent results include:    Labs notable for creatinine level of 1.71, the last level about 10 years ago had a normal creatinine then.  Hyponatremia, hyperkalemia.  Increase in BUN.  CBC with a leukocytosis of 12, the rest of blood work is currently pending.   Imaging Studies:  CT renal stone study ordered, however patient is refusing this at this time.  Medicines ordered:  I ordered  medication including fentanyl  for pain control Reevaluation of the patient after these medicines showed that the patient stayed the same I have reviewed the patients home medicines and have made adjustments as needed  Consults:  I requested consultation with Urology Dr. Van Gelinas,  and discussed lab and imaging findings as well as pertinent plan - they recommend: toradol for pain control after discussion of elevated creatine if improvement will see in office.   Reevaluation:  After the interventions noted above I re-evaluated patient and found that they have :resolved  Social Determinants of Health:  The patient's social determinants of health were a factor in the care of this patient   Problem List / ED Course:  Patient presenting to the ED with a chief complaint of right flank pain, reports he was diagnosed with a kidney stone over the last few days, evaluated at [facility] originally, had a CT scan that does show 4 mm stone, he reports he was given a prescription for tramadol, there was no improvement in his symptoms with taking this medication.  Today he returns as he had 1 episode of vomiting, reports worsening pain to his right flank.  According to son at the bedside, he also has not had a bowel movement in approximately 4 days.  He is passing gas.  Interpretation of his blood work here reveals CMP with elevated potassium, creatinine is elevated, I do not have any comparison from this previous labs aside from 10 years ago, unsure whether this is a new AKI.  UA with large hemoglobin, greater then 50 red blood cells.  Rare bacteria do not suspect infection at this time.  CBC with a leukocytosis.  I discussed this case with urology on-call, they did recommend Toradol for pain control despite renal function. CT  renal was ordered in order to further evaluate his kidneys.  Patient is refusing this at this time.  The son reports that he has been very constipated, therefore we will prescribe him some  senna.  The discussed narcotic pain medication can make him more drowsy, patient is agreeable at this time.  Hemodynamically stable for discharge.   Dispostion:  After consideration of the diagnostic results and the patients response to treatment, I feel that the patent would benefit from follow up with urology.    Portions of this note were generated with Scientist, clinical (histocompatibility and immunogenetics). Dictation errors may occur despite best attempts at proofreading.   Final Clinical Impression(s) / ED Diagnoses Final diagnoses:  Right flank pain  Constipation, unspecified constipation type    Rx / DC Orders ED Discharge Orders          Ordered    oxyCODONE-acetaminophen (PERCOCET/ROXICET) 5-325 MG tablet  Every 6 hours PRN        07/16/23 1423    senna-docusate (SENOKOT-S) 8.6-50 MG tablet  Daily        07/16/23 1423              Jadesola Poynter, PA-C 07/16/23 1513    Tegeler, Marine Sia, MD 07/16/23 1606

## 2023-07-16 NOTE — ED Triage Notes (Addendum)
 Pt came in via POV d/t kidney stones that he was told he had 4 days ago & states the pain is getting worse & he cannot wait until his urologist appointment tomorrow to see a provider d/t his 9/10 pain with n/v. Also reports constipation for the last 5 days.

## 2023-07-17 DIAGNOSIS — N201 Calculus of ureter: Secondary | ICD-10-CM | POA: Diagnosis not present

## 2023-07-18 ENCOUNTER — Other Ambulatory Visit: Payer: Self-pay

## 2023-07-18 ENCOUNTER — Inpatient Hospital Stay (HOSPITAL_COMMUNITY)

## 2023-07-18 ENCOUNTER — Observation Stay (HOSPITAL_COMMUNITY)
Admission: EM | Admit: 2023-07-18 | Discharge: 2023-07-20 | Disposition: A | Discharge status: Home / Self Care | Attending: Internal Medicine | Admitting: Internal Medicine

## 2023-07-18 DIAGNOSIS — Z955 Presence of coronary angioplasty implant and graft: Secondary | ICD-10-CM | POA: Diagnosis not present

## 2023-07-18 DIAGNOSIS — E782 Mixed hyperlipidemia: Secondary | ICD-10-CM | POA: Diagnosis not present

## 2023-07-18 DIAGNOSIS — E871 Hypo-osmolality and hyponatremia: Secondary | ICD-10-CM | POA: Diagnosis not present

## 2023-07-18 DIAGNOSIS — R109 Unspecified abdominal pain: Secondary | ICD-10-CM

## 2023-07-18 DIAGNOSIS — N138 Other obstructive and reflux uropathy: Secondary | ICD-10-CM | POA: Diagnosis not present

## 2023-07-18 DIAGNOSIS — I10 Essential (primary) hypertension: Secondary | ICD-10-CM | POA: Diagnosis not present

## 2023-07-18 DIAGNOSIS — Z79899 Other long term (current) drug therapy: Secondary | ICD-10-CM | POA: Diagnosis not present

## 2023-07-18 DIAGNOSIS — N1330 Unspecified hydronephrosis: Secondary | ICD-10-CM | POA: Diagnosis present

## 2023-07-18 DIAGNOSIS — I2510 Atherosclerotic heart disease of native coronary artery without angina pectoris: Secondary | ICD-10-CM

## 2023-07-18 DIAGNOSIS — N281 Cyst of kidney, acquired: Secondary | ICD-10-CM | POA: Diagnosis not present

## 2023-07-18 DIAGNOSIS — K5900 Constipation, unspecified: Secondary | ICD-10-CM | POA: Diagnosis not present

## 2023-07-18 DIAGNOSIS — N132 Hydronephrosis with renal and ureteral calculous obstruction: Secondary | ICD-10-CM | POA: Diagnosis not present

## 2023-07-18 DIAGNOSIS — N289 Disorder of kidney and ureter, unspecified: Secondary | ICD-10-CM | POA: Diagnosis not present

## 2023-07-18 DIAGNOSIS — N201 Calculus of ureter: Secondary | ICD-10-CM | POA: Diagnosis not present

## 2023-07-18 DIAGNOSIS — N400 Enlarged prostate without lower urinary tract symptoms: Secondary | ICD-10-CM | POA: Insufficient documentation

## 2023-07-18 DIAGNOSIS — Z9861 Coronary angioplasty status: Secondary | ICD-10-CM

## 2023-07-18 DIAGNOSIS — M1990 Unspecified osteoarthritis, unspecified site: Secondary | ICD-10-CM | POA: Diagnosis not present

## 2023-07-18 DIAGNOSIS — Z87891 Personal history of nicotine dependence: Secondary | ICD-10-CM | POA: Insufficient documentation

## 2023-07-18 DIAGNOSIS — N35812 Other urethral bulbous stricture, male: Secondary | ICD-10-CM | POA: Diagnosis not present

## 2023-07-18 DIAGNOSIS — Z7982 Long term (current) use of aspirin: Secondary | ICD-10-CM | POA: Diagnosis not present

## 2023-07-18 DIAGNOSIS — N179 Acute kidney failure, unspecified: Secondary | ICD-10-CM | POA: Diagnosis present

## 2023-07-18 LAB — COMPREHENSIVE METABOLIC PANEL WITH GFR
ALT: 20 U/L (ref 0–44)
AST: 20 U/L (ref 15–41)
Albumin: 3.4 g/dL — ABNORMAL LOW (ref 3.5–5.0)
Alkaline Phosphatase: 55 U/L (ref 38–126)
Anion gap: 10 (ref 5–15)
BUN: 30 mg/dL — ABNORMAL HIGH (ref 8–23)
CO2: 24 mmol/L (ref 22–32)
Calcium: 8.6 mg/dL — ABNORMAL LOW (ref 8.9–10.3)
Chloride: 98 mmol/L (ref 98–111)
Creatinine, Ser: 1.89 mg/dL — ABNORMAL HIGH (ref 0.61–1.24)
GFR, Estimated: 35 mL/min — ABNORMAL LOW (ref >60)
Glucose, Bld: 119 mg/dL — ABNORMAL HIGH (ref 70–99)
Potassium: 4.7 mmol/L (ref 3.5–5.1)
Sodium: 132 mmol/L — ABNORMAL LOW (ref 135–145)
Total Bilirubin: 1.6 mg/dL — ABNORMAL HIGH (ref 0.0–1.2)
Total Protein: 6.8 g/dL (ref 6.5–8.1)

## 2023-07-18 LAB — CBC WITH DIFFERENTIAL/PLATELET
Abs Immature Granulocytes: 0.03 10*3/uL (ref 0.00–0.07)
Basophils Absolute: 0 10*3/uL (ref 0.0–0.1)
Basophils Relative: 0 %
Eosinophils Absolute: 0.1 10*3/uL (ref 0.0–0.5)
Eosinophils Relative: 1 %
HCT: 43.3 % (ref 39.0–52.0)
Hemoglobin: 15.1 g/dL (ref 13.0–17.0)
Immature Granulocytes: 0 %
Lymphocytes Relative: 9 %
Lymphs Abs: 1 10*3/uL (ref 0.7–4.0)
MCH: 32.4 pg (ref 26.0–34.0)
MCHC: 34.9 g/dL (ref 30.0–36.0)
MCV: 92.9 fL (ref 80.0–100.0)
Monocytes Absolute: 0.8 10*3/uL (ref 0.1–1.0)
Monocytes Relative: 8 %
Neutro Abs: 8.9 10*3/uL — ABNORMAL HIGH (ref 1.7–7.7)
Neutrophils Relative %: 82 %
Platelets: 215 10*3/uL (ref 150–400)
RBC: 4.66 MIL/uL (ref 4.22–5.81)
RDW: 12.2 % (ref 11.5–15.5)
WBC: 10.9 10*3/uL — ABNORMAL HIGH (ref 4.0–10.5)
nRBC: 0 % (ref 0.0–0.2)

## 2023-07-18 LAB — URINALYSIS, ROUTINE W REFLEX MICROSCOPIC
Bilirubin Urine: NEGATIVE
Glucose, UA: NEGATIVE mg/dL
Hgb urine dipstick: NEGATIVE
Ketones, ur: NEGATIVE mg/dL
Leukocytes,Ua: NEGATIVE
Nitrite: NEGATIVE
Protein, ur: NEGATIVE mg/dL
Specific Gravity, Urine: 1.006 (ref 1.005–1.030)
pH: 7 (ref 5.0–8.0)

## 2023-07-18 LAB — LIPASE, BLOOD: Lipase: 28 U/L (ref 11–51)

## 2023-07-18 MED ORDER — SENNOSIDES-DOCUSATE SODIUM 8.6-50 MG PO TABS: 1.0000 | ORAL_TABLET | Freq: Every day | ORAL | Status: DC

## 2023-07-18 MED ORDER — KETOROLAC TROMETHAMINE 15 MG/ML IJ SOLN: 15.0000 mg | Freq: Once | INTRAMUSCULAR | Status: DC

## 2023-07-18 MED ORDER — ONDANSETRON HCL 4 MG PO TABS: 4.0000 mg | ORAL_TABLET | Freq: Four times a day (QID) | ORAL | Status: DC | PRN

## 2023-07-18 MED ORDER — SODIUM CHLORIDE 0.9% FLUSH: 10.0000 mL | INTRAVENOUS | Status: DC | PRN

## 2023-07-18 MED ORDER — ENOXAPARIN SODIUM 40 MG/0.4ML IJ SOSY
40.0000 mg | PREFILLED_SYRINGE | INTRAMUSCULAR | Status: DC
  Administered 2023-07-18 – 2023-07-19 (×2): 40 mg via SUBCUTANEOUS
  Filled 2023-07-18 (×3): qty 0.4

## 2023-07-18 MED ORDER — SODIUM CHLORIDE 0.9% FLUSH
3.0000 mL | Freq: Two times a day (BID) | INTRAVENOUS | Status: DC
  Administered 2023-07-18 – 2023-07-19 (×3): 3 mL via INTRAVENOUS

## 2023-07-18 MED ORDER — ONDANSETRON 4 MG PO TBDP
4.0000 mg | ORAL_TABLET | Freq: Once | ORAL | Status: AC
Start: 2023-07-18 — End: 2023-07-18
  Administered 2023-07-18: 4 mg via ORAL
  Filled 2023-07-18: qty 1

## 2023-07-18 MED ORDER — CALCIUM GLUCONATE-NACL 1-0.675 GM/50ML-% IV SOLN
1.0000 g | Freq: Once | INTRAVENOUS | Status: AC
  Administered 2023-07-18: 1000 mg via INTRAVENOUS
  Filled 2023-07-18: qty 50

## 2023-07-18 MED ORDER — SODIUM CHLORIDE 0.9 % IV SOLN: INTRAVENOUS | Status: DC

## 2023-07-18 MED ORDER — FINASTERIDE 5 MG PO TABS
5.0000 mg | ORAL_TABLET | Freq: Every day | ORAL | Status: DC
  Administered 2023-07-19 – 2023-07-20 (×2): 5 mg via ORAL
  Filled 2023-07-18 (×2): qty 1

## 2023-07-18 MED ORDER — ONDANSETRON HCL 4 MG/2ML IJ SOLN: 4.0000 mg | Freq: Once | INTRAMUSCULAR | Status: DC

## 2023-07-18 MED ORDER — ACETAMINOPHEN 650 MG RE SUPP: 650.0000 mg | Freq: Four times a day (QID) | RECTAL | Status: DC | PRN

## 2023-07-18 MED ORDER — KETOROLAC TROMETHAMINE 30 MG/ML IJ SOLN
30.0000 mg | Freq: Once | INTRAMUSCULAR | Status: DC
  Filled 2023-07-18: qty 1

## 2023-07-18 MED ORDER — OXYCODONE HCL 5 MG PO TABS
5.0000 mg | ORAL_TABLET | Freq: Once | ORAL | Status: AC
  Administered 2023-07-18: 5 mg via ORAL
  Filled 2023-07-18: qty 1

## 2023-07-18 MED ORDER — HYDROMORPHONE HCL 1 MG/ML IJ SOLN
1.0000 mg | Freq: Once | INTRAMUSCULAR | Status: AC
  Administered 2023-07-18: 1 mg via INTRAVENOUS
  Filled 2023-07-18: qty 1

## 2023-07-18 MED ORDER — ROSUVASTATIN CALCIUM 10 MG PO TABS
10.0000 mg | ORAL_TABLET | Freq: Every day | ORAL | Status: DC
  Administered 2023-07-19 – 2023-07-20 (×2): 10 mg via ORAL
  Filled 2023-07-18 (×2): qty 1

## 2023-07-18 MED ORDER — HYDROMORPHONE HCL 1 MG/ML IJ SOLN: 0.5000 mg | Freq: Once | INTRAMUSCULAR | Status: DC

## 2023-07-18 MED ORDER — ONDANSETRON HCL 4 MG/2ML IJ SOLN
4.0000 mg | Freq: Four times a day (QID) | INTRAMUSCULAR | Status: DC | PRN
  Administered 2023-07-19: 4 mg via INTRAVENOUS

## 2023-07-18 MED ORDER — SODIUM CHLORIDE 0.9 % IV BOLUS
500.0000 mL | Freq: Once | INTRAVENOUS | Status: AC
  Administered 2023-07-18: 500 mL via INTRAVENOUS

## 2023-07-18 MED ORDER — ACETAMINOPHEN 325 MG PO TABS: 650.0000 mg | ORAL_TABLET | Freq: Four times a day (QID) | ORAL | Status: DC | PRN

## 2023-07-18 MED ORDER — TAMSULOSIN HCL 0.4 MG PO CAPS
0.4000 mg | ORAL_CAPSULE | Freq: Every day | ORAL | Status: DC
  Administered 2023-07-19 – 2023-07-20 (×2): 0.4 mg via ORAL
  Filled 2023-07-18 (×2): qty 1

## 2023-07-18 MED ORDER — ASPIRIN 81 MG PO TBEC
81.0000 mg | DELAYED_RELEASE_TABLET | Freq: Every day | ORAL | Status: DC
  Administered 2023-07-19 – 2023-07-20 (×2): 81 mg via ORAL
  Filled 2023-07-18 (×2): qty 1

## 2023-07-18 MED ORDER — NEBIVOLOL HCL 10 MG PO TABS
10.0000 mg | ORAL_TABLET | Freq: Every day | ORAL | Status: DC
  Administered 2023-07-19 – 2023-07-20 (×2): 10 mg via ORAL
  Filled 2023-07-18 (×2): qty 1

## 2023-07-18 MED ORDER — MECLIZINE HCL 25 MG PO TABS: 25.0000 mg | ORAL_TABLET | Freq: Two times a day (BID) | ORAL | Status: DC | PRN

## 2023-07-18 MED ORDER — SENNOSIDES-DOCUSATE SODIUM 8.6-50 MG PO TABS
1.0000 | ORAL_TABLET | Freq: Two times a day (BID) | ORAL | Status: DC
  Administered 2023-07-18 – 2023-07-19 (×3): 1 via ORAL
  Filled 2023-07-18 (×4): qty 1

## 2023-07-18 MED ORDER — OXYCODONE-ACETAMINOPHEN 5-325 MG PO TABS: 1.0000 | ORAL_TABLET | Freq: Four times a day (QID) | ORAL | Status: DC | PRN

## 2023-07-18 MED ORDER — ALBUTEROL SULFATE (2.5 MG/3ML) 0.083% IN NEBU: 2.5000 mg | INHALATION_SOLUTION | Freq: Four times a day (QID) | RESPIRATORY_TRACT | Status: DC | PRN

## 2023-07-18 MED ORDER — CEFAZOLIN SODIUM-DEXTROSE 2-4 GM/100ML-% IV SOLN
2.0000 g | Freq: Once | INTRAVENOUS | Status: AC
  Administered 2023-07-19: 2 g via INTRAVENOUS

## 2023-07-18 NOTE — H&P (Signed)
 History and Physical    Patient: Wesley Johnson EXB:[id number] DOB: Jun 29, 1940 DOA: 07/18/2023 DOS: the patient was seen and examined on 07/18/2023 PCP: Jeannine Milroy., MD  Patient coming from: Home  Chief Complaint:  Chief Complaint  Patient presents with   right flank pain   HPI: Wesley Johnson is a 83 y.o. male with medical history significant of hypertension, hyperlipidemia, CAD, nephrolithiasis, BPH, and GERD who presents with worsening worsening pain due to a kidney stone.  He has been experiencing worsening pain primarily located in the right flank.  Symptoms have been present for least the last 5 days.  He initially had gone to his primary care provider's office and had been sent for an outpatient CT scan of the abdomen pelvis which was performed 5/15.  Scan revealed stones in the mid to distal right ureter measuring 4 mm  and 4 mm with mild hydroureteronephrosis.  Due to his symptoms he was seen in the emergency department on 5/18 and had been prescribed oxycodone  and a stool softener with plans to see the urologist yesterday.  He had gone to urology as recommended, but was unable to have any procedure performed performed due to him having eaten. It was then scheduled to be done approximately two weeks later. However, due to worsening pain which was not being managed managed well with the pain medications, he returned to the hospital for further evaluation.     No fever, chills, blood in urine, or discomfort while urinating.  Patient does report having not had a bowel movement in a few days now.   Patient was noted to have stable vital signs.  Labs significant for WBC 10.9, sodium 132, BUN 30, and creatinine 1.89.  Urinalysis did not note any significant signs signs for infection.  Urology had been consulted and plan on taking to the operating room tomorrow at [facility].  Review of Systems: As mentioned in the history of present illness. All other systems reviewed and are  negative. Past Medical History:  Diagnosis Date   Arthritis    BPH (benign prostatic hyperplasia)    CAD (coronary artery disease) 10/04/2010   s/p PCI of the Ramus, nonobstructive disease of LAD, LCx, and RCA   Diverticulosis    DJD (degenerative joint disease)    GERD (gastroesophageal reflux disease)    Glaucoma    Hemorrhoids    Hyperlipidemia    Hypertension    IBS (irritable bowel syndrome)    Impaired fasting glucose    Insomnia    Kidney stone    Lumbar stenosis    Metabolic syndrome    Tubular adenoma of colon 07/29/2005   Past Surgical History:  Procedure Laterality Date   CORONARY ANGIOPLASTY WITH STENT PLACEMENT  10/04/10   PCI Ramus with Promus DES   Social History:  reports that he quit smoking about 47 years ago. His smoking use included cigarettes. He has never used smokeless tobacco. He reports that he does not drink alcohol  and does not use drugs.  Allergies  Allergen Reactions   Amoxicillin-Pot Clavulanate     Other reaction(s): stomach upset   Codeine     Other reaction(s): sick on stomach   Ofloxacin Nausea And Vomiting    Family History  Problem Relation Age of Onset   Stroke Mother    Diabetes Mother    High blood pressure Mother    Lung cancer Father    Bladder Cancer Brother    Coronary artery disease Maternal Grandfather  Diabetes Daughter    Colon polyps Daughter    Colon polyps Son    High blood pressure Son    High Cholesterol Son     Prior to Admission medications   Medication Sig Start Date End Date Taking? Authorizing Provider  aspirin 81 MG chewable tablet Chew 81 mg by mouth daily.      [provider]  ciprofloxacin  (CIPRO ) 250 MG tablet Take 250 mg by mouth as needed. 02/26/21   [provider]  cyclobenzaprine  (FLEXERIL ) 10 MG tablet Take 1 tablet (10 mg total) by mouth 2 (two) times daily as needed for muscle spasms (do not use with other muscle relaxers such as methocarbomol). 07/19/13   Maurie Southern, DO   diclofenac  sodium (VOLTAREN ) 1 % GEL Apply 2 g topically as needed.    [provider]  esomeprazole (NEXIUM) 20 MG capsule Take 1 capsule by mouth daily. 10/30/19   [provider]  finasteride  (PROSCAR ) 5 MG tablet Take 1 tablet (5 mg total) by mouth daily. 03/05/13   Janece Means, MD  Hydrocortisone-Iodoquinol 1-1 % CREA as needed.    [provider]  losartan  (COZAAR ) 100 MG tablet TAKE ONE TABLET BY MOUTH EVERY DAY 03/05/13   Jenkins, John E, MD  meclizine (ANTIVERT) 25 MG tablet as needed for dizziness. 07/09/21   [provider]  nebivolol  (BYSTOLIC ) 10 MG tablet TAKE 1 TABLET EVERY DAY 04/15/22   Chandrasekhar, Mahesh A, MD  nitroGLYCERIN  (NITROSTAT ) 0.4 MG SL tablet Place 1 tablet (0.4 mg total) under the tongue every 5 (five) minutes as needed for chest pain (MAX 3 TABLETS). 08/29/22   Jann Melody, MD  oxyCODONE -acetaminophen  (PERCOCET/ROXICET) 5-325 MG tablet Take 1 tablet by mouth every 6 (six) hours as needed for up to 3 days for severe pain (pain score 7-10). 07/16/23 07/19/23  Soto, Johana, PA-C  polyethylene glycol powder (GLYCOLAX /MIRALAX ) powder Take 17 g by mouth daily as needed for mild constipation.     Asencion Blacksmith, MD  promethazine  (PHENERGAN ) 25 MG tablet Take 1 tablet (25 mg total) by mouth every 6 (six) hours as needed for nausea. 05/01/13   Donley Furth, MD  rosuvastatin (CRESTOR) 10 MG tablet Take 10 mg by mouth at bedtime. 04/03/21   [provider]  senna-docusate (SENOKOT-S) 8.6-50 MG tablet Take 1 tablet by mouth daily for 5 days. 07/16/23 07/21/23  Soto, Johana, PA-C  tamsulosin (FLOMAX) 0.4 MG CAPS capsule Take 0.4 mg by mouth daily. 02/14/21   [provider]    Physical Exam: Vitals:   07/18/23 0829 07/18/23 1242  BP: 127/64 125/76  Pulse: 67 84  Resp: 18 20  Temp: 97.7 F (36.5 C)   SpO2: 97% 97%   Constitutional: Elderly male currently in no acute distress Eyes: PERRL, lids and conjunctivae  normal ENMT: Mucous membranes are moist. Posterior pharynx clear of any exudate or lesions.Normal dentition.  Neck: normal, supple, no masses, no thyromegaly Respiratory: clear to auscultation bilaterally, no wheezing, no crackles. Normal respiratory effort. No accessory muscle use.  Cardiovascular: Regular rate and rhythm, no murmurs / rubs / gallops. No extremity edema. 2+ pedal pulses. No carotid bruits.  Abdomen: no tenderness, no masses palpated. No hepatosplenomegaly. Bowel sounds positive.  Musculoskeletal: no clubbing / cyanosis. No joint deformity upper and lower extremities. Good ROM, no contractures. Normal muscle tone.  Skin: no rashes, lesions, ulcers. No induration Neurologic: CN 2-12 grossly intact. Sensation intact, DTR normal. Strength 5/5 in all 4.  Psychiatric: Normal judgment and insight. Alert and oriented x 3. Normal mood.   Data Reviewed:   Reviewed labs, imaging, and pertinent records as documented.  Assessment and Plan:  Ureteral stone with hydronephrosis Acute.  Patient presents with complaints of at least a 5-day history of right-sided flank pain.  CT scan from 5/15 noted to stones in the mid to distal right ureter measuring 4 mm  and 4 mm with mild hydroureteronephrosis.  Urinalysis did not show any significant signs for infection at this time, but have been abnormal on the 18th. - Admit to a MedSurg bed - Continue oxycodone  as needed for pain - N.p.o. after midnight prior to procedure - Check renal ultrasound - Oxycodone /Dilaudid IV as needed for moderate to severe pain respectively - Appreciate urology consultative services, will follow-up for any further recommendations.  Suspected acute kidney injury Creatinine noted to be elevated up to 1.89 with BUN 30.  No recent baseline to compare.  Suspect secondary to obstruction related to kidney stone. -Avoid nephrotoxic agents - Gentle IV fluids - Continue to monitor kidney function  Hyponatremia Acute.   Sodium levels noted to be 132. - Recheck BMP in a.m.  Constipation Patient reports last bowel movement a couple days ago. -Senokot-S twice daily  Hypertension Blood pressures currently maintaining 125/76 to 139/73. - Continue Bystolic  and losartan   CAD  Patient with prior single-vessel coronary disease of the ramus intermedius branch treated with drug-eluting stent back in 09/2010 -Continue aspirin and statin  Hyperlipidemia - Continue Crestor  BPH -Continue finasteride  and Proscar    Obesity  BMI 1.06 kg/m.  DVT prophylaxis: Lovenox Advance Care Planning:   Code Status: Full Code    Consults: Urology  Family Communication: None  Severity of Illness: The appropriate patient status for this patient is INPATIENT. Inpatient status is judged to be reasonable and necessary in order to provide the required intensity of service to ensure the patient's safety. The patient's presenting symptoms, physical exam findings, and initial radiographic and laboratory data in the context of their chronic comorbidities is felt to place them at high risk for further clinical deterioration. Furthermore, it is not anticipated that the patient will be medically stable for discharge from the hospital within 2 midnights of admission.   * I certify that at the point of admission it is my clinical judgment that the patient will require inpatient hospital care spanning beyond 2 midnights from the point of admission due to high intensity of service, high risk for further deterioration and high frequency of surveillance required.*  Author: Lena Qualia, MD 07/18/2023 2:35 PM  For on call review [email or web address].

## 2023-07-18 NOTE — Plan of Care (Signed)
 Patient is an 83 year old male known to our practice.  He was seen in clinic yesterday for two 4 mm right ureteral stones, mid and distal respectively.  He was offered ureteroscopy and elected to proceed with medical expulsive therapy.  He presents to [facility] emergency department for ongoing pain and is now requesting surgical intervention.  We have found some OR availability tomorrow morning at [facility] and have requested that he be transferred to the ED here, admit to hospitalist service for pain management, and I have posted him for surgical intervention at 830 tomorrow morning.  Please ensure the patient is n.p.o. at midnight and attach me as the consulting provider once he is present.  Please feel free to call with questions or concerns  Alla Ar, NP [facility] Pager: [phone]

## 2023-07-18 NOTE — ED Notes (Signed)
 Pt ambulated to bathroom

## 2023-07-18 NOTE — ED Notes (Signed)
 [facility] called for transport to [facility].  No eta available.

## 2023-07-18 NOTE — H&P (View-Only) (Signed)
 H&P Physician requesting consult: Manny Sees  Chief Complaint: right ureteral stones  History of Present Illness: 83 YO M with 2 distal ureteral stones has failed trial passage. Presented to ER again with severe pain. AFVSS. WBC 10.9 and Cr 1.89. UA negative.  Past Medical History:  Diagnosis Date   Arthritis    BPH (benign prostatic hyperplasia)    CAD (coronary artery disease) 10/04/2010   s/p PCI of the Ramus, nonobstructive disease of LAD, LCx, and RCA   Diverticulosis    DJD (degenerative joint disease)    GERD (gastroesophageal reflux disease)    Glaucoma    Hemorrhoids    Hyperlipidemia    Hypertension    IBS (irritable bowel syndrome)    Impaired fasting glucose    Insomnia    Kidney stone    Lumbar stenosis    Metabolic syndrome    Tubular adenoma of colon 07/29/2005   Past Surgical History:  Procedure Laterality Date   CORONARY ANGIOPLASTY WITH STENT PLACEMENT  10/04/10   PCI Ramus with Promus DES    Home Medications:  Medications Prior to Admission  Medication Sig Dispense Refill Last Dose/Taking   acetaminophen  (TYLENOL ) 500 MG tablet Take 1,000 mg by mouth 2 (two) times daily as needed for mild pain (pain score 1-3), headache or fever.   Past Week   aspirin EC 81 MG tablet Take 81 mg by mouth daily.   07/18/2023 Morning   ciprofloxacin  (CIPRO ) 250 MG tablet Take 250 mg by mouth 2 (two) times daily as needed ("prostate").   Unknown   finasteride  (PROSCAR ) 5 MG tablet Take 1 tablet (5 mg total) by mouth daily. 90 tablet 3 07/18/2023 Morning   losartan  (COZAAR ) 100 MG tablet TAKE ONE TABLET BY MOUTH EVERY DAY 90 tablet 3 07/18/2023 Morning   meclizine (ANTIVERT) 25 MG tablet as needed for dizziness.   Past Month   nebivolol  (BYSTOLIC ) 10 MG tablet TAKE 1 TABLET EVERY DAY (Patient taking differently: Take 10 mg by mouth daily. BRAND NAME) 30 tablet 0 07/18/2023 Morning   nitroGLYCERIN  (NITROSTAT ) 0.4 MG SL tablet Place 1 tablet (0.4 mg total) under the tongue every 5  (five) minutes as needed for chest pain (MAX 3 TABLETS). 25 tablet 2 Unknown   ondansetron  (ZOFRAN ) 4 MG tablet Take 4 mg by mouth 2 (two) times daily as needed for nausea or vomiting.   Past Week   oxyCODONE -acetaminophen  (PERCOCET/ROXICET) 5-325 MG tablet Take 1 tablet by mouth every 6 (six) hours as needed for up to 3 days for severe pain (pain score 7-10). 15 tablet 0 Past Week   rosuvastatin (CRESTOR) 10 MG tablet Take 10 mg by mouth daily.   07/18/2023 Morning   tamsulosin (FLOMAX) 0.4 MG CAPS capsule Take 0.4 mg by mouth daily.   07/18/2023 Morning   traMADol (ULTRAM) 50 MG tablet Take 50 mg by mouth 2 (two) times daily as needed for moderate pain (pain score 4-6).   07/17/2023 Bedtime   senna-docusate (SENOKOT-S) 8.6-50 MG tablet Take 1 tablet by mouth daily for 5 days. (Patient not taking: Reported on 07/18/2023) 5 tablet 0 Not Taking   Allergies:  Allergies  Allergen Reactions   Augmentin [Amoxicillin-Pot Clavulanate] Other (See Comments)    Stomach upset   Codeine Nausea And Vomiting   Ocuflox [Ofloxacin] Nausea And Vomiting    Family History  Problem Relation Age of Onset   Stroke Mother    Diabetes Mother    High blood pressure Mother    Lung  cancer Father    Bladder Cancer Brother    Coronary artery disease Maternal Grandfather    Diabetes Daughter    Colon polyps Daughter    Colon polyps Son    High blood pressure Son    High Cholesterol Son    Social History:  reports that he quit smoking about 47 years ago. His smoking use included cigarettes. He has never used smokeless tobacco. He reports that he does not drink alcohol  and does not use drugs.  ROS: A complete review of systems was performed.  All systems are negative except for pertinent findings as noted. ROS   Physical Exam:  Vital signs in last 24 hours: Temp:  [97.7 F (36.5 C)-99 F (37.2 C)] 98.3 F (36.8 C) (05/20 1904) Pulse Rate:  [58-84] 58 (05/20 1904) Resp:  [16-20] 16 (05/20 1904) BP:  (125-160)/(64-80) 149/68 (05/20 1904) SpO2:  [95 %-97 %] 95 % (05/20 1904) General:  Alert and oriented, No acute distress HEENT: Normocephalic, atraumatic Neck: No JVD or lymphadenopathy Cardiovascular: Regular rate and rhythm Lungs: Regular rate and effort Abdomen: Soft, nontender, nondistended, no abdominal masses Back: No CVA tenderness Extremities: No edema Neurologic: Grossly intact  Laboratory Data:  Results for orders placed or performed during the hospital encounter of 07/18/23 (from the past 24 hours)  Urinalysis, Routine w reflex microscopic -Urine, Clean Catch     Status: None   Collection Time: 07/18/23  9:43 AM  Result Value Ref Range   Color, Urine YELLOW YELLOW   APPearance CLEAR CLEAR   Specific Gravity, Urine 1.006 1.005 - 1.030   pH 7.0 5.0 - 8.0   Glucose, UA NEGATIVE NEGATIVE mg/dL   Hgb urine dipstick NEGATIVE NEGATIVE   Bilirubin Urine NEGATIVE NEGATIVE   Ketones, ur NEGATIVE NEGATIVE mg/dL   Protein, ur NEGATIVE NEGATIVE mg/dL   Nitrite NEGATIVE NEGATIVE   Leukocytes,Ua NEGATIVE NEGATIVE  CBC with Differential/Platelet     Status: Abnormal   Collection Time: 07/18/23 12:45 PM  Result Value Ref Range   WBC 10.9 (H) 4.0 - 10.5 K/uL   RBC 4.66 4.22 - 5.81 MIL/uL   Hemoglobin 15.1 13.0 - 17.0 g/dL   HCT 16.1 09.6 - 04.5 %   MCV 92.9 80.0 - 100.0 fL   MCH 32.4 26.0 - 34.0 pg   MCHC 34.9 30.0 - 36.0 g/dL   RDW 40.9 81.1 - 91.4 %   Platelets 215 150 - 400 K/uL   nRBC 0.0 0.0 - 0.2 %   Neutrophils Relative % 82 %   Neutro Abs 8.9 (H) 1.7 - 7.7 K/uL   Lymphocytes Relative 9 %   Lymphs Abs 1.0 0.7 - 4.0 K/uL   Monocytes Relative 8 %   Monocytes Absolute 0.8 0.1 - 1.0 K/uL   Eosinophils Relative 1 %   Eosinophils Absolute 0.1 0.0 - 0.5 K/uL   Basophils Relative 0 %   Basophils Absolute 0.0 0.0 - 0.1 K/uL   Immature Granulocytes 0 %   Abs Immature Granulocytes 0.03 0.00 - 0.07 K/uL  Comprehensive metabolic panel with GFR     Status: Abnormal    Collection Time: 07/18/23 12:45 PM  Result Value Ref Range   Sodium 132 (L) 135 - 145 mmol/L   Potassium 4.7 3.5 - 5.1 mmol/L   Chloride 98 98 - 111 mmol/L   CO2 24 22 - 32 mmol/L   Glucose, Bld 119 (H) 70 - 99 mg/dL   BUN 30 (H) 8 - 23 mg/dL   Creatinine, Ser  1.89 (H) 0.61 - 1.24 mg/dL   Calcium 8.6 (L) 8.9 - 10.3 mg/dL   Total Protein 6.8 6.5 - 8.1 g/dL   Albumin 3.4 (L) 3.5 - 5.0 g/dL   AST 20 15 - 41 U/L   ALT 20 0 - 44 U/L   Alkaline Phosphatase 55 38 - 126 U/L   Total Bilirubin 1.6 (H) 0.0 - 1.2 mg/dL   GFR, Estimated 35 (L) >60 mL/min   Anion gap 10 5 - 15  Lipase, blood     Status: None   Collection Time: 07/18/23 12:45 PM  Result Value Ref Range   Lipase 28 11 - 51 U/L   No results found for this or any previous visit (from the past 240 hours). Creatinine: Recent Labs    07/16/23 0955 07/18/23 1245  CREATININE 1.71* 1.89*    Impression/Assessment:  Right ureteral caluli Right ureteral obstruction secondary to calculi Renal colic  Plan:  Plan for cystoscopy with right ureteroscopy with laser lithotripsy and ureteral stent placement. R/B discussed including but not limited to bleeding, infection, need for additional procedures, ureteral avulsion, need for staged procedure among other imponderables. Plan to proceed in the morning  Maralyn Sender, III 07/18/2023, 7:51 PM

## 2023-07-18 NOTE — ED Triage Notes (Addendum)
 Son stated, he has a kidney stone on the right side they said last Wednesday and its hurting him more. Having some nausea, and no other symptoms. He was given Zofran  when he went home.Pt was seen by Dr. Inga Manges yesterday and wanted to have surgery but had already eaten, and they said it would be another 2 weeks before they could do it. Given Tramadol, Oxycotin . Pt took a Tramadol this morning around 730.

## 2023-07-18 NOTE — ED Provider Notes (Signed)
 [facility] AT [facility] Provider Note  CSN: [id number] Arrival date & time: 07/18/23 9678  Chief Complaint(s) right flank pain  HPI Wesley Johnson is a 83 y.o. male with past medical history as below, significant for BPH, CAD, hypertension, nephrolithiasis who presents to the ED with complaint of flank pain  Seen in the ER 5/18 similar complaints, concern for kidney stone.  There was concern for kidney stone but patient did not want imaging during that evaluation. Family reports outpatient CT around a week ago with nephrolithiasis x2 on the right. He was advised to follow-up with urology in the office.  Patient reports that he saw Dr. Inga Manges yesterday and they would do procedure but could not complete it because he was not n.p.o.  He comes into the ER today with ongoing pain unrelieved with home analgesics.  No fever, no vomiting, does report reduced urine output past 24 hours.  Past Medical History Past Medical History:  Diagnosis Date   Arthritis    BPH (benign prostatic hyperplasia)    CAD (coronary artery disease) 10/04/2010   s/p PCI of the Ramus, nonobstructive disease of LAD, LCx, and RCA   Diverticulosis    DJD (degenerative joint disease)    GERD (gastroesophageal reflux disease)    Glaucoma    Hemorrhoids    Hyperlipidemia    Hypertension    IBS (irritable bowel syndrome)    Impaired fasting glucose    Insomnia    Kidney stone    Lumbar stenosis    Metabolic syndrome    Tubular adenoma of colon 07/29/2005   Patient Active Problem List   Diagnosis Date Noted   Benign paroxysmal positional vertigo due to bilateral vestibular disorder 08/29/2022   Numbness and tingling in left arm 05/01/2013   OTHER SEBORRHEIC DERMATITIS 11/12/2009   OSTEOARTHRITIS 08/11/2009   ALLERGIC RHINITIS DUE TO OTHER ALLERGEN 07/08/2009   NECK PAIN, LEFT 07/08/2009   IRRITABLE BOWEL SYNDROME 02/04/2009   History of colonic polyps 01/07/2009   LEG CRAMPS  03/03/2008   CAD in native artery 02/25/2008   LLQ abdominal pain 02/25/2008   BILIOUS EMESIS 01/17/2008   DYSPHAGIA 01/17/2008   FUNGAL DERMATITIS 12/06/2007   ABDOMINAL TENDERNESS, LEFT LOWER QUADRANT 12/06/2007   NAUSEA 11/22/2007   INSOMNIA 10/24/2007   ARTHRITIS, RIGHT SHOULDER 05/22/2007   DEGENERATIVE DISC DISEASE, 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 Authorizing Provider  aspirin 81 MG chewable tablet Chew 81 mg by mouth daily.      [provider]  ciprofloxacin  (CIPRO ) 250 MG tablet Take 250 mg by mouth as needed. 02/26/21   [provider]  cyclobenzaprine  (FLEXERIL ) 10 MG tablet Take 1 tablet (10 mg total) by mouth 2 (two) times daily as needed for muscle spasms (do not use with other muscle relaxers such as methocarbomol). 07/19/13   Maurie Southern, DO  diclofenac  sodium (VOLTAREN ) 1 % GEL Apply 2 g topically as needed.    [provider]  esomeprazole (NEXIUM) 20 MG capsule Take 1 capsule by mouth daily. 10/30/19   [provider]  finasteride  (PROSCAR ) 5 MG tablet Take 1 tablet (5 mg total) by mouth daily.  03/05/13   Janece Means, MD  Hydrocortisone-Iodoquinol 1-1 % CREA as needed.    [provider]  losartan  (COZAAR ) 100 MG tablet TAKE ONE TABLET BY MOUTH EVERY DAY 03/05/13   Jenkins, John E, MD  meclizine (ANTIVERT) 25 MG tablet as needed for dizziness. 07/09/21   [provider]  nebivolol  (BYSTOLIC ) 10 MG tablet TAKE 1 TABLET EVERY DAY 04/15/22   Chandrasekhar, Mahesh A, MD  nitroGLYCERIN  (NITROSTAT ) 0.4 MG SL tablet Place 1 tablet (0.4 mg total) under the tongue  every 5 (five) minutes as needed for chest pain (MAX 3 TABLETS). 08/29/22   Jann Melody, MD  oxyCODONE -acetaminophen  (PERCOCET/ROXICET) 5-325 MG tablet Take 1 tablet by mouth every 6 (six) hours as needed for up to 3 days for severe pain (pain score 7-10). 07/16/23 07/19/23  Soto, Johana, PA-C  polyethylene glycol powder (GLYCOLAX /MIRALAX ) powder Take 17 g by mouth daily as needed for mild constipation.     Asencion Blacksmith, MD  promethazine  (PHENERGAN ) 25 MG tablet Take 1 tablet (25 mg total) by mouth every 6 (six) hours as needed for nausea. 05/01/13   Donley Furth, MD  rosuvastatin (CRESTOR) 10 MG tablet Take 10 mg by mouth at bedtime. 04/03/21   [provider]  senna-docusate (SENOKOT-S) 8.6-50 MG tablet Take 1 tablet by mouth daily for 5 days. 07/16/23 07/21/23  Soto, Johana, PA-C  tamsulosin (FLOMAX) 0.4 MG CAPS capsule Take 0.4 mg by mouth daily. 02/14/21   [provider]                                                                                                                                    Past Surgical History Past Surgical History:  Procedure Laterality Date   CORONARY ANGIOPLASTY WITH STENT PLACEMENT  10/04/10   PCI Ramus with Promus DES   Family History Family History  Problem Relation Age of Onset   Stroke Mother    Diabetes Mother    High blood pressure Mother    Lung cancer Father    Bladder Cancer Brother    Coronary artery disease Maternal Grandfather    Diabetes Daughter    Colon polyps Daughter    Colon polyps Son    High blood pressure Son    High Cholesterol Son     Social History Social History   Tobacco Use   Smoking status: Former    Current packs/day: 0.00    Types: Cigarettes    Quit date: 09/30/1975    Years since quitting: 47.8   Smokeless tobacco: Never   Tobacco comments:    quit in 1977 smoked 1.5 -2 ppd  Vaping Use   Vaping status: Never Used  Substance Use Topics   Alcohol  use: No    Alcohol /week: 0.0  standard drinks of alcohol    Drug use: No   Allergies Amoxicillin-pot clavulanate, Codeine, and Ofloxacin  Review of Systems A thorough review  of systems was obtained and all systems are negative except as noted in the HPI and PMH.   Physical Exam Vital Signs  I have reviewed the triage vital signs BP 125/76   Pulse 84   Temp 97.7 F (36.5 C)   Resp 20   SpO2 97%  Physical Exam Vitals and nursing note reviewed.  Constitutional:      General: He is not in acute distress.    Appearance: Normal appearance. He is well-developed. He is not ill-appearing.  HENT:     Head: Normocephalic and atraumatic.     Right Ear: External ear normal.     Left Ear: External ear normal.     Nose: Nose normal.     Mouth/Throat:     Mouth: Mucous membranes are moist.  Eyes:     General: No scleral icterus.       Right eye: No discharge.        Left eye: No discharge.  Cardiovascular:     Rate and Rhythm: Normal rate.  Pulmonary:     Effort: Pulmonary effort is normal. No respiratory distress.     Breath sounds: No stridor.  Abdominal:     General: Abdomen is flat. There is no distension.     Palpations: Abdomen is soft.     Tenderness: There is abdominal tenderness. There is no guarding.    Musculoskeletal:        General: No deformity.     Cervical back: No rigidity.  Skin:    General: Skin is warm and dry.     Coloration: Skin is not cyanotic, jaundiced or pale.  Neurological:     Mental Status: He is alert and oriented to person, place, and time.     GCS: GCS eye subscore is 4. GCS verbal subscore is 5. GCS motor subscore is 6.  Psychiatric:        Speech: Speech normal.        Behavior: Behavior normal. Behavior is cooperative.     ED Results and Treatments Labs (all labs ordered are listed, but only abnormal results are displayed) Labs Reviewed  CBC WITH DIFFERENTIAL/PLATELET - Abnormal; Notable for the following components:      Result Value   WBC 10.9 (*)    Neutro  Abs 8.9 (*)    All other components within normal limits  COMPREHENSIVE METABOLIC PANEL WITH GFR - Abnormal; Notable for the following components:   Sodium 132 (*)    Glucose, Bld 119 (*)    BUN 30 (*)    Creatinine, Ser 1.89 (*)    Calcium 8.6 (*)    Albumin 3.4 (*)    Total Bilirubin 1.6 (*)    GFR, Estimated 35 (*)    All other components within normal limits  URINALYSIS, ROUTINE W REFLEX MICROSCOPIC  LIPASE, BLOOD  CBC WITH DIFFERENTIAL/PLATELET  Radiology No results found.  Pertinent labs & imaging results that were available during my care of the patient were reviewed by me and considered in my medical decision making (see MDM for details).  Medications Ordered in ED Medications  sodium chloride  flush (NS) 0.9 % injection 10-40 mL (has no administration in time range)  ketorolac  (TORADOL ) 15 MG/ML injection 15 mg (has no administration in time range)  sodium chloride  0.9 % bolus 500 mL (0 mLs Intravenous Stopped 07/18/23 1407)  oxyCODONE  (Oxy IR/ROXICODONE ) immediate release tablet 5 mg (5 mg Oral Given 07/18/23 1053)  ondansetron  (ZOFRAN -ODT) disintegrating tablet 4 mg (4 mg Oral Given 07/18/23 1053)  HYDROmorphone (DILAUDID) injection 1 mg (1 mg Intravenous Given 07/18/23 1336)                                                                                                                                     Procedures Procedures  (including critical care time)  Medical Decision Making / ED Course    Medical Decision Making:    BURREL LEGRAND is a 83 y.o. male with past medical history as below, significant for BPH, CAD, hypertension, nephrolithiasis who presents to the ED with complaint of flank pain. The complaint involves an extensive differential diagnosis and also carries with it a high risk of complications and morbidity.  Serious etiology was  considered. Ddx includes but is not limited to: Differential diagnosis includes but is not exclusive to acute appendicitis, renal colic, testicular torsion, urinary tract infection, prostatitis,  diverticulitis, small bowel obstruction, colitis, abdominal aortic aneurysm, gastroenteritis, constipation etc.   Complete initial physical exam performed, notably the patient was in no distress, sitting comfortably on stretcher.    Reviewed and confirmed nursing documentation for past medical history, family history, social history.  Vital signs reviewed.     Brief summary:  83 year old male history of nephrolithiasis here with right-sided flank pain, nephrolithiasis Saw urology PA yesterday in the office, unable to perform stent secondary to n.p.o. status Patient came in today for ongoing abdominal pain, flank pain Will check renal function, will discuss with urology.  Patient does not want imaging at this time   Clinical Course as of 07/18/23 1426  Tue Jul 18, 2023  1357 Spoke with urology, will call back [SG]  1417 Creatinine(!): 1.89 Worsened from prior [SG]  1417 Spoke with urology PA Cam Sattenfield, recommend obs for hospitalist/ send to [facility] for stent placement tomorrow am [SG]    Clinical Course User Index [SG] Teddi Favors, DO     Spoke with urology, they we will perform stent tomorrow at [facility].  Recommend admit to hospitalist into [facility].  Patient and family agreeable   [facility] will admit              Additional history obtained: -Additional history obtained from family -External records from outside source obtained and reviewed including: Chart review including previous  notes, labs, imaging, consultation notes including  Recent ER evaluation, prior labs   Lab Tests: -I ordered, reviewed, and interpreted labs.   The pertinent results include:   Labs Reviewed  CBC WITH DIFFERENTIAL/PLATELET - Abnormal; Notable for the following components:      Result  Value   WBC 10.9 (*)    Neutro Abs 8.9 (*)    All other components within normal limits  COMPREHENSIVE METABOLIC PANEL WITH GFR - Abnormal; Notable for the following components:   Sodium 132 (*)    Glucose, Bld 119 (*)    BUN 30 (*)    Creatinine, Ser 1.89 (*)    Calcium 8.6 (*)    Albumin 3.4 (*)    Total Bilirubin 1.6 (*)    GFR, Estimated 35 (*)    All other components within normal limits  URINALYSIS, ROUTINE W REFLEX MICROSCOPIC  LIPASE, BLOOD  CBC WITH DIFFERENTIAL/PLATELET    Notable for creatinine elevated, worsened from prior, urinalysis infection  EKG   EKG Interpretation Date/Time:    Ventricular Rate:    PR Interval:    QRS Duration:    QT Interval:    QTC Calculation:   R Axis:      Text Interpretation:           Imaging Studies ordered: na   Medicines ordered and prescription drug management: Meds ordered this encounter  Medications   DISCONTD: HYDROmorphone (DILAUDID) injection 0.5 mg   DISCONTD: ondansetron  (ZOFRAN ) injection 4 mg   sodium chloride  0.9 % bolus 500 mL   oxyCODONE  (Oxy IR/ROXICODONE ) immediate release tablet 5 mg    Refill:  0   ondansetron  (ZOFRAN -ODT) disintegrating tablet 4 mg   DISCONTD: ketorolac  (TORADOL ) 30 MG/ML injection 30 mg   HYDROmorphone (DILAUDID) injection 1 mg   sodium chloride  flush (NS) 0.9 % injection 10-40 mL   ketorolac  (TORADOL ) 15 MG/ML injection 15 mg    -I have reviewed the patients home medicines and have made adjustments as needed   Consultations Obtained: I requested consultation with the urology,  and discussed lab and imaging findings as well as pertinent plan - they recommend: send to [facility]   Cardiac Monitoring: The patient was maintained on a cardiac monitor.  I personally viewed and interpreted the cardiac monitored which showed an underlying rhythm of: nsr Continuous pulse oximetry interpreted by myself, 97% on RA.    Social Determinants of Health:  Diagnosis or treatment significantly  limited by social determinants of health: former smoker   Reevaluation: After the interventions noted above, I reevaluated the patient and found that they have improved  Co morbidities that complicate the patient evaluation  Past Medical History:  Diagnosis Date   Arthritis    BPH (benign prostatic hyperplasia)    CAD (coronary artery disease) 10/04/2010   s/p PCI of the Ramus, nonobstructive disease of LAD, LCx, and RCA   Diverticulosis    DJD (degenerative joint disease)    GERD (gastroesophageal reflux disease)    Glaucoma    Hemorrhoids    Hyperlipidemia    Hypertension    IBS (irritable bowel syndrome)    Impaired fasting glucose    Insomnia    Kidney stone    Lumbar stenosis    Metabolic syndrome    Tubular adenoma of colon 07/29/2005      Dispostion: Disposition decision including need for hospitalization was considered, and patient admitted to the hospital.    Final Clinical Impression(s) / ED Diagnoses Final diagnoses:  Obstructive  nephropathy  Intractable abdominal pain  Renal insufficiency        Teddi Favors, DO 07/19/23 1626

## 2023-07-18 NOTE — Consult Note (Signed)
 H&P Physician requesting consult: Wesley Johnson  Chief Complaint: right ureteral stones  History of Present Illness: 83 YO M with 2 distal ureteral stones has failed trial passage. Presented to ER again with severe pain. AFVSS. WBC 10.9 and Cr 1.89. UA negative.  Past Medical History:  Diagnosis Date   Arthritis    BPH (benign prostatic hyperplasia)    CAD (coronary artery disease) 10/04/2010   s/p PCI of the Ramus, nonobstructive disease of LAD, LCx, and RCA   Diverticulosis    DJD (degenerative joint disease)    GERD (gastroesophageal reflux disease)    Glaucoma    Hemorrhoids    Hyperlipidemia    Hypertension    IBS (irritable bowel syndrome)    Impaired fasting glucose    Insomnia    Kidney stone    Lumbar stenosis    Metabolic syndrome    Tubular adenoma of colon 07/29/2005   Past Surgical History:  Procedure Laterality Date   CORONARY ANGIOPLASTY WITH STENT PLACEMENT  10/04/10   PCI Ramus with Promus DES    Home Medications:  Medications Prior to Admission  Medication Sig Dispense Refill Last Dose/Taking   acetaminophen  (TYLENOL ) 500 MG tablet Take 1,000 mg by mouth 2 (two) times daily as needed for mild pain (pain score 1-3), headache or fever.   Past Week   aspirin EC 81 MG tablet Take 81 mg by mouth daily.   07/18/2023 Morning   ciprofloxacin  (CIPRO ) 250 MG tablet Take 250 mg by mouth 2 (two) times daily as needed ("prostate").   Unknown   finasteride  (PROSCAR ) 5 MG tablet Take 1 tablet (5 mg total) by mouth daily. 90 tablet 3 07/18/2023 Morning   losartan  (COZAAR ) 100 MG tablet TAKE ONE TABLET BY MOUTH EVERY DAY 90 tablet 3 07/18/2023 Morning   meclizine (ANTIVERT) 25 MG tablet as needed for dizziness.   Past Month   nebivolol  (BYSTOLIC ) 10 MG tablet TAKE 1 TABLET EVERY DAY (Patient taking differently: Take 10 mg by mouth daily. BRAND NAME) 30 tablet 0 07/18/2023 Morning   nitroGLYCERIN  (NITROSTAT ) 0.4 MG SL tablet Place 1 tablet (0.4 mg total) under the tongue every 5  (five) minutes as needed for chest pain (MAX 3 TABLETS). 25 tablet 2 Unknown   ondansetron  (ZOFRAN ) 4 MG tablet Take 4 mg by mouth 2 (two) times daily as needed for nausea or vomiting.   Past Week   oxyCODONE -acetaminophen  (PERCOCET/ROXICET) 5-325 MG tablet Take 1 tablet by mouth every 6 (six) hours as needed for up to 3 days for severe pain (pain score 7-10). 15 tablet 0 Past Week   rosuvastatin (CRESTOR) 10 MG tablet Take 10 mg by mouth daily.   07/18/2023 Morning   tamsulosin (FLOMAX) 0.4 MG CAPS capsule Take 0.4 mg by mouth daily.   07/18/2023 Morning   traMADol (ULTRAM) 50 MG tablet Take 50 mg by mouth 2 (two) times daily as needed for moderate pain (pain score 4-6).   07/17/2023 Bedtime   senna-docusate (SENOKOT-S) 8.6-50 MG tablet Take 1 tablet by mouth daily for 5 days. (Patient not taking: Reported on 07/18/2023) 5 tablet 0 Not Taking   Allergies:  Allergies  Allergen Reactions   Augmentin [Amoxicillin-Pot Clavulanate] Other (See Comments)    Stomach upset   Codeine Nausea And Vomiting   Ocuflox [Ofloxacin] Nausea And Vomiting    Family History  Problem Relation Age of Onset   Stroke Mother    Diabetes Mother    High blood pressure Mother    Lung  cancer Father    Bladder Cancer Brother    Coronary artery disease Maternal Grandfather    Diabetes Daughter    Colon polyps Daughter    Colon polyps Son    High blood pressure Son    High Cholesterol Son    Social History:  reports that he quit smoking about 47 years ago. His smoking use included cigarettes. He has never used smokeless tobacco. He reports that he does not drink alcohol  and does not use drugs.  ROS: A complete review of systems was performed.  All systems are negative except for pertinent findings as noted. ROS   Physical Exam:  Vital signs in last 24 hours: Temp:  [97.7 F (36.5 C)-99 F (37.2 C)] 98.3 F (36.8 C) (05/20 1904) Pulse Rate:  [58-84] 58 (05/20 1904) Resp:  [16-20] 16 (05/20 1904) BP:  (125-160)/(64-80) 149/68 (05/20 1904) SpO2:  [95 %-97 %] 95 % (05/20 1904) General:  Alert and oriented, No acute distress HEENT: Normocephalic, atraumatic Neck: No JVD or lymphadenopathy Cardiovascular: Regular rate and rhythm Lungs: Regular rate and effort Abdomen: Soft, nontender, nondistended, no abdominal masses Back: No CVA tenderness Extremities: No edema Neurologic: Grossly intact  Laboratory Data:  Results for orders placed or performed during the hospital encounter of 07/18/23 (from the past 24 hours)  Urinalysis, Routine w reflex microscopic -Urine, Clean Catch     Status: None   Collection Time: 07/18/23  9:43 AM  Result Value Ref Range   Color, Urine YELLOW YELLOW   APPearance CLEAR CLEAR   Specific Gravity, Urine 1.006 1.005 - 1.030   pH 7.0 5.0 - 8.0   Glucose, UA NEGATIVE NEGATIVE mg/dL   Hgb urine dipstick NEGATIVE NEGATIVE   Bilirubin Urine NEGATIVE NEGATIVE   Ketones, ur NEGATIVE NEGATIVE mg/dL   Protein, ur NEGATIVE NEGATIVE mg/dL   Nitrite NEGATIVE NEGATIVE   Leukocytes,Ua NEGATIVE NEGATIVE  CBC with Differential/Platelet     Status: Abnormal   Collection Time: 07/18/23 12:45 PM  Result Value Ref Range   WBC 10.9 (H) 4.0 - 10.5 K/uL   RBC 4.66 4.22 - 5.81 MIL/uL   Hemoglobin 15.1 13.0 - 17.0 g/dL   HCT 16.1 09.6 - 04.5 %   MCV 92.9 80.0 - 100.0 fL   MCH 32.4 26.0 - 34.0 pg   MCHC 34.9 30.0 - 36.0 g/dL   RDW 40.9 81.1 - 91.4 %   Platelets 215 150 - 400 K/uL   nRBC 0.0 0.0 - 0.2 %   Neutrophils Relative % 82 %   Neutro Abs 8.9 (H) 1.7 - 7.7 K/uL   Lymphocytes Relative 9 %   Lymphs Abs 1.0 0.7 - 4.0 K/uL   Monocytes Relative 8 %   Monocytes Absolute 0.8 0.1 - 1.0 K/uL   Eosinophils Relative 1 %   Eosinophils Absolute 0.1 0.0 - 0.5 K/uL   Basophils Relative 0 %   Basophils Absolute 0.0 0.0 - 0.1 K/uL   Immature Granulocytes 0 %   Abs Immature Granulocytes 0.03 0.00 - 0.07 K/uL  Comprehensive metabolic panel with GFR     Status: Abnormal    Collection Time: 07/18/23 12:45 PM  Result Value Ref Range   Sodium 132 (L) 135 - 145 mmol/L   Potassium 4.7 3.5 - 5.1 mmol/L   Chloride 98 98 - 111 mmol/L   CO2 24 22 - 32 mmol/L   Glucose, Bld 119 (H) 70 - 99 mg/dL   BUN 30 (H) 8 - 23 mg/dL   Creatinine, Ser  1.89 (H) 0.61 - 1.24 mg/dL   Calcium 8.6 (L) 8.9 - 10.3 mg/dL   Total Protein 6.8 6.5 - 8.1 g/dL   Albumin 3.4 (L) 3.5 - 5.0 g/dL   AST 20 15 - 41 U/L   ALT 20 0 - 44 U/L   Alkaline Phosphatase 55 38 - 126 U/L   Total Bilirubin 1.6 (H) 0.0 - 1.2 mg/dL   GFR, Estimated 35 (L) >60 mL/min   Anion gap 10 5 - 15  Lipase, blood     Status: None   Collection Time: 07/18/23 12:45 PM  Result Value Ref Range   Lipase 28 11 - 51 U/L   No results found for this or any previous visit (from the past 240 hours). Creatinine: Recent Labs    07/16/23 0955 07/18/23 1245  CREATININE 1.71* 1.89*    Impression/Assessment:  Right ureteral caluli Right ureteral obstruction secondary to calculi Renal colic  Plan:  Plan for cystoscopy with right ureteroscopy with laser lithotripsy and ureteral stent placement. R/B discussed including but not limited to bleeding, infection, need for additional procedures, ureteral avulsion, need for staged procedure among other imponderables. Plan to proceed in the morning  Maralyn Sender, III 07/18/2023, 7:51 PM

## 2023-07-19 ENCOUNTER — Encounter (HOSPITAL_COMMUNITY): Payer: Self-pay | Admitting: Internal Medicine

## 2023-07-19 ENCOUNTER — Inpatient Hospital Stay (HOSPITAL_COMMUNITY)

## 2023-07-19 ENCOUNTER — Inpatient Hospital Stay (HOSPITAL_COMMUNITY): Admitting: Anesthesiology

## 2023-07-19 ENCOUNTER — Encounter (HOSPITAL_COMMUNITY)
Admission: EM | Disposition: A | Discharge status: Home / Self Care | Payer: Self-pay | Source: Home / Self Care | Attending: Internal Medicine

## 2023-07-19 ENCOUNTER — Inpatient Hospital Stay (HOSPITAL_BASED_OUTPATIENT_CLINIC_OR_DEPARTMENT_OTHER): Admitting: Anesthesiology

## 2023-07-19 DIAGNOSIS — I10 Essential (primary) hypertension: Secondary | ICD-10-CM | POA: Diagnosis not present

## 2023-07-19 DIAGNOSIS — N201 Calculus of ureter: Secondary | ICD-10-CM | POA: Diagnosis not present

## 2023-07-19 DIAGNOSIS — N35912 Unspecified bulbous urethral stricture, male: Secondary | ICD-10-CM

## 2023-07-19 DIAGNOSIS — N132 Hydronephrosis with renal and ureteral calculous obstruction: Secondary | ICD-10-CM | POA: Diagnosis not present

## 2023-07-19 DIAGNOSIS — N138 Other obstructive and reflux uropathy: Principal | ICD-10-CM

## 2023-07-19 DIAGNOSIS — I2510 Atherosclerotic heart disease of native coronary artery without angina pectoris: Secondary | ICD-10-CM | POA: Diagnosis not present

## 2023-07-19 DIAGNOSIS — E871 Hypo-osmolality and hyponatremia: Secondary | ICD-10-CM | POA: Diagnosis not present

## 2023-07-19 DIAGNOSIS — E782 Mixed hyperlipidemia: Secondary | ICD-10-CM | POA: Diagnosis not present

## 2023-07-19 DIAGNOSIS — N1330 Unspecified hydronephrosis: Secondary | ICD-10-CM | POA: Diagnosis present

## 2023-07-19 DIAGNOSIS — Z87891 Personal history of nicotine dependence: Secondary | ICD-10-CM

## 2023-07-19 DIAGNOSIS — N400 Enlarged prostate without lower urinary tract symptoms: Secondary | ICD-10-CM | POA: Diagnosis not present

## 2023-07-19 DIAGNOSIS — N179 Acute kidney failure, unspecified: Secondary | ICD-10-CM | POA: Diagnosis not present

## 2023-07-19 DIAGNOSIS — K5900 Constipation, unspecified: Secondary | ICD-10-CM | POA: Diagnosis not present

## 2023-07-19 HISTORY — PX: CYSTOSCOPY/URETEROSCOPY/HOLMIUM LASER/STENT PLACEMENT: SHX6546

## 2023-07-19 LAB — BASIC METABOLIC PANEL WITH GFR
Anion gap: 8 (ref 5–15)
BUN: 29 mg/dL — ABNORMAL HIGH (ref 8–23)
CO2: 26 mmol/L (ref 22–32)
Calcium: 8.2 mg/dL — ABNORMAL LOW (ref 8.9–10.3)
Chloride: 98 mmol/L (ref 98–111)
Creatinine, Ser: 1.66 mg/dL — ABNORMAL HIGH (ref 0.61–1.24)
GFR, Estimated: 41 mL/min — ABNORMAL LOW (ref >60)
Glucose, Bld: 98 mg/dL (ref 70–99)
Potassium: 4.1 mmol/L (ref 3.5–5.1)
Sodium: 132 mmol/L — ABNORMAL LOW (ref 135–145)

## 2023-07-19 LAB — CBC
HCT: 38.4 % — ABNORMAL LOW (ref 39.0–52.0)
Hemoglobin: 13.1 g/dL (ref 13.0–17.0)
MCH: 32.1 pg (ref 26.0–34.0)
MCHC: 34.1 g/dL (ref 30.0–36.0)
MCV: 94.1 fL (ref 80.0–100.0)
Platelets: 207 10*3/uL (ref 150–400)
RBC: 4.08 MIL/uL — ABNORMAL LOW (ref 4.22–5.81)
RDW: 12.2 % (ref 11.5–15.5)
WBC: 8.4 10*3/uL (ref 4.0–10.5)
nRBC: 0 % (ref 0.0–0.2)

## 2023-07-19 SURGERY — CYSTOSCOPY/URETEROSCOPY/HOLMIUM LASER/STENT PLACEMENT
Anesthesia: General | Laterality: Right

## 2023-07-19 MED ORDER — LIDOCAINE HCL (PF) 2 % IJ SOLN
INTRAMUSCULAR | Status: DC | PRN
Start: 2023-07-19 — End: 2023-07-19
  Administered 2023-07-19: 100 mg via INTRADERMAL

## 2023-07-19 MED ORDER — AMISULPRIDE (ANTIEMETIC) 5 MG/2ML IV SOLN: 10.0000 mg | Freq: Once | INTRAVENOUS | Status: DC | PRN

## 2023-07-19 MED ORDER — OXYCODONE HCL 5 MG PO TABS: 5.0000 mg | ORAL_TABLET | Freq: Once | ORAL | Status: DC | PRN

## 2023-07-19 MED ORDER — CEFAZOLIN SODIUM-DEXTROSE 2-4 GM/100ML-% IV SOLN
INTRAVENOUS | Status: AC
  Filled 2023-07-19: qty 100

## 2023-07-19 MED ORDER — IOHEXOL 300 MG/ML  SOLN
INTRAMUSCULAR | Status: DC | PRN
  Administered 2023-07-19: 20 mL via URETHRAL

## 2023-07-19 MED ORDER — PROPOFOL 10 MG/ML IV BOLUS
INTRAVENOUS | Status: AC
  Filled 2023-07-19: qty 20

## 2023-07-19 MED ORDER — PHENAZOPYRIDINE HCL 100 MG PO TABS
100.0000 mg | ORAL_TABLET | Freq: Three times a day (TID) | ORAL | Status: DC
  Administered 2023-07-19 – 2023-07-20 (×2): 100 mg via ORAL
  Filled 2023-07-19 (×2): qty 1

## 2023-07-19 MED ORDER — POLYETHYLENE GLYCOL 3350 17 G PO PACK
17.0000 g | PACK | Freq: Two times a day (BID) | ORAL | Status: DC
  Administered 2023-07-19: 17 g via ORAL
  Filled 2023-07-19 (×2): qty 1

## 2023-07-19 MED ORDER — OXYCODONE HCL 5 MG/5ML PO SOLN: 5.0000 mg | Freq: Once | ORAL | Status: DC | PRN

## 2023-07-19 MED ORDER — EPHEDRINE 5 MG/ML INJ
INTRAVENOUS | Status: AC
  Filled 2023-07-19: qty 10

## 2023-07-19 MED ORDER — EPHEDRINE SULFATE-NACL 50-0.9 MG/10ML-% IV SOSY
PREFILLED_SYRINGE | INTRAVENOUS | Status: DC | PRN
  Administered 2023-07-19 (×3): 5 mg via INTRAVENOUS
  Administered 2023-07-19: 10 mg via INTRAVENOUS
  Administered 2023-07-19: 2.5 mg via INTRAVENOUS
  Administered 2023-07-19: 5 mg via INTRAVENOUS
  Administered 2023-07-19: 10 mg via INTRAVENOUS

## 2023-07-19 MED ORDER — FENTANYL CITRATE (PF) 100 MCG/2ML IJ SOLN
INTRAMUSCULAR | Status: AC
  Filled 2023-07-19: qty 2

## 2023-07-19 MED ORDER — DEXAMETHASONE SODIUM PHOSPHATE 10 MG/ML IJ SOLN
INTRAMUSCULAR | Status: DC | PRN
Start: 2023-07-19 — End: 2023-07-19
  Administered 2023-07-19: 10 mg via INTRAVENOUS

## 2023-07-19 MED ORDER — SORBITOL 70 % SOLN: 30.0000 mL | Freq: Every day | Status: DC | PRN

## 2023-07-19 MED ORDER — PHENYLEPHRINE HCL (PRESSORS) 10 MG/ML IV SOLN
INTRAVENOUS | Status: DC | PRN
  Administered 2023-07-19: 40 ug via INTRAVENOUS
  Administered 2023-07-19 (×2): 80 ug via INTRAVENOUS

## 2023-07-19 MED ORDER — FENTANYL CITRATE (PF) 100 MCG/2ML IJ SOLN
INTRAMUSCULAR | Status: DC | PRN
  Administered 2023-07-19: 25 ug via INTRAVENOUS

## 2023-07-19 MED ORDER — DEXAMETHASONE SODIUM PHOSPHATE 10 MG/ML IJ SOLN
INTRAMUSCULAR | Status: AC
  Filled 2023-07-19: qty 1

## 2023-07-19 MED ORDER — LIDOCAINE HCL (PF) 2 % IJ SOLN
INTRAMUSCULAR | Status: AC
  Filled 2023-07-19: qty 5

## 2023-07-19 MED ORDER — LACTATED RINGERS IV SOLN: INTRAVENOUS | Status: DC | PRN

## 2023-07-19 MED ORDER — SODIUM CHLORIDE 0.9 % IR SOLN
Status: DC | PRN
  Administered 2023-07-19: 6000 mL via INTRAVESICAL

## 2023-07-19 MED ORDER — ONDANSETRON HCL 4 MG/2ML IJ SOLN
INTRAMUSCULAR | Status: AC
  Filled 2023-07-19: qty 2

## 2023-07-19 MED ORDER — PROPOFOL 10 MG/ML IV BOLUS
INTRAVENOUS | Status: DC | PRN
Start: 2023-07-19 — End: 2023-07-19
  Administered 2023-07-19 (×2): 100 mg via INTRAVENOUS

## 2023-07-19 MED ORDER — CHLORHEXIDINE GLUCONATE 0.12 % MT SOLN
15.0000 mL | Freq: Once | OROMUCOSAL | Status: AC
  Administered 2023-07-19: 15 mL via OROMUCOSAL

## 2023-07-19 MED ORDER — LACTATED RINGERS IV SOLN: Freq: Once | INTRAVENOUS | Status: AC

## 2023-07-19 MED ORDER — FENTANYL CITRATE PF 50 MCG/ML IJ SOSY: 25.0000 ug | PREFILLED_SYRINGE | INTRAMUSCULAR | Status: DC | PRN

## 2023-07-19 SURGICAL SUPPLY — 21 items
BAG URO CATCHER STRL LF (MISCELLANEOUS) ×1 IMPLANT
BASKET LASER NITINOL 1.9FR (BASKET) IMPLANT
BASKET ZERO TIP NITINOL 2.4FR (BASKET) IMPLANT
CATH URETERAL DUAL LUMEN 10F (MISCELLANEOUS) IMPLANT
CATH URETL OPEN END 6FR 70 (CATHETERS) ×1 IMPLANT
CLOTH BEACON ORANGE TIMEOUT ST (SAFETY) ×1 IMPLANT
FIBER LASER MOSES 200 DFL (Laser) IMPLANT
GLOVE BIO SURGEON STRL SZ7.5 (GLOVE) ×1 IMPLANT
GOWN STRL REUS W/ TWL XL LVL3 (GOWN DISPOSABLE) ×1 IMPLANT
GUIDEWIRE ANG ZIPWIRE 038X150 (WIRE) IMPLANT
GUIDEWIRE STR DUAL SENSOR (WIRE) ×1 IMPLANT
GUIDEWIRE ZIPWRE .038 STRAIGHT (WIRE) IMPLANT
KIT TURNOVER KIT A (KITS) IMPLANT
MANIFOLD NEPTUNE II (INSTRUMENTS) ×1 IMPLANT
PACK CYSTO (CUSTOM PROCEDURE TRAY) ×1 IMPLANT
SHEATH NAVIGATOR HD 11/13X28 (SHEATH) IMPLANT
SHEATH NAVIGATOR HD 11/13X36 (SHEATH) IMPLANT
STENT URET 6FRX26 CONTOUR (STENTS) IMPLANT
TRACTIP FLEXIVA PULS ID 200XHI (Laser) IMPLANT
TUBING CONNECTING 10 (TUBING) ×1 IMPLANT
TUBING UROLOGY SET (TUBING) ×1 IMPLANT

## 2023-07-19 NOTE — Care Management CC44 (Signed)
 Condition Code 44 Documentation Completed  Patient Details  Name: Wesley Johnson MRN: [id number] Date of Birth: 1940/07/22   Condition Code 44 given:  Yes Patient signature on Condition Code 44 notice:  Yes Documentation of 2 MD's agreement:  Yes Code 44 added to claim:  Yes    Bari Leys, RN 07/19/2023, 3:23 PM

## 2023-07-19 NOTE — Care Management Obs Status (Signed)
 MEDICARE OBSERVATION STATUS NOTIFICATION   Patient Details  Name: MICHELE KERLIN MRN: [id number] Date of Birth: 04-03-1940   Medicare Observation Status Notification Given:  Yes    Bari Leys, RN 07/19/2023, 3:23 PM

## 2023-07-19 NOTE — Anesthesia Preprocedure Evaluation (Addendum)
 Anesthesia Evaluation  Patient identified by MRN, date of birth, ID band Patient awake    Reviewed: Allergy & Precautions, NPO status , Patient's Chart, lab work & pertinent test results  History of Anesthesia Complications Negative for: history of anesthetic complications  Airway Mallampati: II  TM Distance: >3 FB Neck ROM: Full    Dental  (+) Edentulous Upper, Edentulous Lower, Dental Advisory Given   Pulmonary neg shortness of breath, neg sleep apnea, neg COPD, neg recent URI, former smoker   Pulmonary exam normal breath sounds clear to auscultation       Cardiovascular hypertension, Pt. on medications and Pt. on home beta blockers (-) angina + CAD and + Cardiac Stents  (-) dysrhythmias  Rhythm:Regular Rate:Normal  HLD  TTE 04/29/2021: IMPRESSIONS    1. Left ventricular ejection fraction, by estimation, is 50%. The left  ventricle has mildly decreased function. The left ventricle demonstrates  global hypokinesis. There is mild left ventricular hypertrophy. Left  ventricular diastolic parameters are  consistent with Grade I diastolic dysfunction (impaired relaxation).   2. Right ventricular systolic function is normal. The right ventricular  size is normal. Tricuspid regurgitation signal is inadequate for assessing  PA pressure.   3. The mitral valve is normal in structure. Trivial mitral valve  regurgitation. No evidence of mitral stenosis.   4. The aortic valve is tricuspid. There is mild calcification of the  aortic valve. Aortic valve regurgitation is not visualized. Aortic valve  sclerosis/calcification is present, without any evidence of aortic  stenosis.   5. Aortic dilatation noted. There is mild dilatation of the ascending  aorta, measuring 38 mm.   6. The inferior vena cava is normal in size with greater than 50%  respiratory variability, suggesting right atrial pressure of 3 mmHg.   7. Technically difficult  study with poor acoustic windows.     Neuro/Psych neg Seizures vertigo  Neuromuscular disease (lumbar stenosis)    GI/Hepatic Neg liver ROS,GERD  ,,Diverticulosis, IBS   Endo/Other  negative endocrine ROS    Renal/GU Renal disease (stone)   BPH     Musculoskeletal  (+) Arthritis , Osteoarthritis,    Abdominal  (+) + obese  Peds  Hematology negative hematology ROS (+)   Anesthesia Other Findings   Reproductive/Obstetrics                              Anesthesia Physical Anesthesia Plan  ASA: 3  Anesthesia Plan: General   Post-op Pain Management:    Induction: Intravenous  PONV Risk Score and Plan: 2 and Ondansetron , Dexamethasone and Treatment may vary due to age or medical condition  Airway Management Planned: LMA  Additional Equipment:   Intra-op Plan:   Post-operative Plan: Extubation in OR  Informed Consent: I have reviewed the patients History and Physical, chart, labs and discussed the procedure including the risks, benefits and alternatives for the proposed anesthesia with the patient or authorized representative who has indicated his/her understanding and acceptance.     Dental advisory given  Plan Discussed with: CRNA and Anesthesiologist  Anesthesia Plan Comments: (Risks of general anesthesia discussed including, but not limited to, sore throat, hoarse voice, chipped/damaged teeth, injury to vocal cords, nausea and vomiting, allergic reactions, lung infection, heart attack, stroke, and death. All questions answered. )        Anesthesia Quick Evaluation

## 2023-07-19 NOTE — Discharge Instructions (Signed)

## 2023-07-19 NOTE — Op Note (Addendum)
 Operative Note  Preoperative diagnosis:  1.  Right ureteral calculi  Postoperative diagnosis: 1.  Right ureteral calculi 2.  Mild bulbar urethral stricture  Procedure(s): 1.  Cystoscopy with urethral dilation 2.  Right retrograde pyelogram, right ureteroscopy with laser lithotripsy, ureteral stent placement   Surgeon: Leila Punt, MD  Assistants: None  Anesthesia: General  Complications: None immediate  EBL: Minimal  Specimens: 1.  Ureteral calculus  Drains/Catheters: 1.  6 x 26 double-J ureteral stent  Intraoperative findings: 1.  Anterior urethra with mild bulbar urethral stricture that was dilated with the cystoscope. 2.  Moderate bilobar hypertrophy of the prostatic urethra 3.  Bladder mucosa without any tumors or masses 4.  Right ureteroscopy confirmed 2 distal ureteral calculi that were fragmented and basket extracted  5.  Minimal right hydronephrosis with no filling defect after treatment of the stone  Indication: 83 year old male with distal right ureteral calculus presents for the previously mentioned operation.  Description of procedure:  The patient was identified and consent was obtained.  The patient was taken to the operating room and placed in the supine position.  The patient was placed under general anesthesia.  Perioperative antibiotics were administered.  The patient was placed in dorsal lithotomy.  Patient was prepped and draped in a standard sterile fashion and a timeout was performed.  A 21 French rigid cystoscope was advanced into the urethra and a urethral stricture was encountered.  I passed a wire through the stricture and into the bladder and was able to navigate the scope through the stricture and dilated.  Urethra was then wide open.  I passed into the bladder and performed a complete cystoscopy with no abnormal findings.  Sensor wire was advanced up the right ureter and into the kidney under fluoroscopic guidance.  Semirigid ureteroscopy was  performed alongside the wire up to the stones of interest which were laser fragmented to smaller fragments followed by basket extraction.  I advanced the scope up the ureter and to the renal pelvis and no other calculi were seen.  I shot a retrograde pyelogram through the scope with findings noted above.  I withdrew the scope visualizing the ureter upon removal.  There was some ureteral edema at the level of stone impaction but no ureteral injury was identified.  I backloaded the wire onto rigid cystoscope and advanced that into the bladder followed by routine placement of a 6 x 26 double-J ureteral stent.  Fluoroscopy confirmed proximal placement and direct visualization confirmed a good coil within the bladder.  I drained the bladder and withdrew the scope.  I visualized the urethra which was wide open upon removal.  This concluded the operation.  Patient tolerated procedure well was stable postoperatively.  Plan: Follow-up in 1 week for stent removal.  Okay for discharge from a urological standpoint

## 2023-07-19 NOTE — Interval H&P Note (Signed)
 History and Physical Interval Note:  07/19/2023 8:30 AM  Wesley Johnson  has presented today for surgery, with the diagnosis of OBSTRUCTED RIGHT URETERAL STONE.  The various methods of treatment have been discussed with the patient and family. After consideration of risks, benefits and other options for treatment, the patient has consented to  Procedure(s): CYSTOSCOPY/URETEROSCOPY/HOLMIUM LASER/STENT PLACEMENT (Right) as a surgical intervention.  The patient's history has been reviewed, patient examined, no change in status, stable for surgery.  I have reviewed the patient's chart and labs.  Questions were answered to the patient's satisfaction.     Maralyn Sender, III

## 2023-07-19 NOTE — Anesthesia Procedure Notes (Signed)
 Procedure Name: LMA Insertion Date/Time: 07/19/2023 8:55 AM  Performed by: Manuela Sella, CRNAPre-anesthesia Checklist: Patient identified, Emergency Drugs available, Suction available and Patient being monitored Patient Re-evaluated:Patient Re-evaluated prior to induction Oxygen Delivery Method: Circle system utilized Preoxygenation: Pre-oxygenation with 100% oxygen Induction Type: IV induction Ventilation: Mask ventilation without difficulty LMA: LMA flexible inserted LMA Size: 5.0 Number of attempts: 1 Placement Confirmation: positive ETCO2 and breath sounds checked- equal and bilateral Tube secured with: Tape Dental Injury: Teeth and Oropharynx as per pre-operative assessment

## 2023-07-19 NOTE — Progress Notes (Signed)
 PROGRESS NOTE    Wesley Johnson  UEA:[id number] DOB: 1940/03/09 DOA: 07/18/2023 PCP: Jeannine Milroy., MD    Chief Complaint  Patient presents with   right flank pain    Brief Narrative:  Patient 83 year old gentleman history of hypertension, hyperlipidemia, CAD, nephrolithiasis, BPH, GERD presented to ED with worsening right flank pain secondary to kidney stone.  Patient noted to have been experiencing pain in the right flank seen by PCP underwent outpatient CT scan on 5/15 which revealed stones in the mid to distal right ureter measuring 4 mm with mild hydroureteronephrosis.  Patient initially seen in the ED 5/18 due to worsening symptoms was placed on a stool softener, oxycodone  and forced to follow-up with urology.  Patient followed up with urology however unable to have procedure performed due to oral intake and procedure postponed.  Due to worsening pain patient presented to the ED.  Urology consulted.  Patient underwent cystoscopy with right uteroscopy with laser lithotripsy and ureteral stent placement this morning 07/19/2023.   Assessment & Plan:   Principal Problem:   Ureteral stone with hydronephrosis Active Problems:   AKI (acute kidney injury) (HCC)   Hyponatremia   Constipation   Essential hypertension   CAD S/P percutaneous coronary angioplasty   Mixed hyperlipidemia   BPH (benign prostatic hyperplasia)  #1 right ureteral calculi with hydronephrosis/right ureteral obstruction secondary to calculi/renal colic - Patient presented with worsening right flank pain, underwent outpatient CT that showed mild right hydroureteronephrosis and kidney stones in the mid to distal right ureter. - Patient followed up with urology in the outpatient setting was to have a procedure however due to oral intake procedure canceled. - Patient presented back to the ED with worsening right flank pain, seen in consultation by urology underwent cystoscopy with urethral dilation, right  retrograde pyelogram, right uteroscopy with laser lithotripsy, ureteral stent placement this morning 07/19/2023. - Patient with complaints of dysuria and as such has been started on Pyridium per urology. - Patient still with some hematuria. - Continue hydration with IV fluids, pain management, supportive care. - Will need outpatient follow-up with urology for stent removal in 1 week. - Per urology.  2.  Suspected AKI -Noted to be elevated at 1.89 with a BUN of 30 on admission. - No recent baseline labs on epic noted. - AKI likely multifactorial secondary to postrenal azotemia secondary to obstruction due to renal calculi in the setting of probable dehydration. - Creatinine slowly trending down currently at 1.66 today from 1.89. - Continue gentle hydration with IV fluids. - Monitor urine output. - Avoid nephrotoxins. - Continue to hold home regimen ARB.  3.  Hyponatremia -Likely secondary to dehydration. - Stable. - Continue IV fluids.  4.  Constipation -Patient noted to have some complaints of constipation on admission. - Continue Senokot-S twice daily. - Start MiraLAX  twice daily. - Sorbitol as needed.  5.  Hypertension -Continue home regimen Bystolic , Proscar . - Continue to hold ARB.  6.  BPH -Continue Flomax, Proscar .  7.  Hyperlipidemia -Resume home regimen statin.  8.  CAD -Patient with prior single-vessel CAD of the ramus intermedius branch status post DES 09/2010. - Continue aspirin, statin, beta-blocker.   DVT prophylaxis: SCDs Code Status: Full Family Communication: Updated patient and son at bedside. Disposition: Home when clinically improved hopefully in the next 24 hours and once renal function continues to improve.  Status is: Observation Not inpatient appropriate, will call [facility] team and downgrade to OBS.    Consultants:  Urology: Dr.  Bell 07/18/2023  Procedures:  Cystoscopy with urethral dilation/right retrograde pyelogram, right ureteroscopy with  laser lithotripsy, ureteral stent placement per urology: Dr. Parke Boll 07/19/2023  Antimicrobials:  Anti-infectives (From admission, onward)    Start     Dose/Rate Route Frequency Ordered Stop   07/19/23 0840  ceFAZolin (ANCEF) 2-4 GM/100ML-% IVPB       Note to Pharmacy: Raymund Calix C: cabinet override      07/19/23 0840 07/19/23 0907   07/19/23 0800  ceFAZolin (ANCEF) IVPB 2g/100 mL premix        2 g 200 mL/hr over 30 Minutes Intravenous  Once 07/18/23 2007 07/19/23 0900         Subjective: Patient lying in bed.  Patient with some hematuria.  States having urine output.  Patient complaining of dysuria.  Denies any chest pain or shortness of breath.  Feels right-sided flank pain has improved postprocedure.  Son at bedside.  Patient wondering when he is going to be able to go home.  Objective: Vitals:   07/19/23 0953 07/19/23 1000 07/19/23 1015 07/19/23 1040  BP: 137/64 (!) 147/76 (!) 143/64 (!) 159/73  Pulse: 61 76 66 79  Resp: 13 17 20 18   Temp: 97.7 F (36.5 C)   (!) 97.3 F (36.3 C)  TempSrc:    Oral  SpO2: 100% 99% 97% 99%  Weight:      Height:        Intake/Output Summary (Last 24 hours) at 07/19/2023 1150 Last data filed at 07/19/2023 1029 Gross per 24 hour  Intake 1180 ml  Output 1200 ml  Net -20 ml   Filed Weights   07/19/23 0741  Weight: 103 kg    Examination:  General exam: Appears calm and comfortable  Respiratory system: Clear to auscultation bilaterally.  No wheezes, no crackles, no rhonchi.  Fair air movement.  Speaking in full sentences.Respiratory effort normal. Cardiovascular system: S1 & S2 heard, RRR. No JVD, murmurs, rubs, gallops or clicks. No pedal edema. Gastrointestinal system: Abdomen is nondistended, soft and nontender. No organomegaly or masses felt. Normal bowel sounds heard. Central nervous system: Alert and oriented. No focal neurological deficits. Extremities: Symmetric 5 x 5 power. Skin: No rashes, lesions or ulcers Psychiatry:  Judgement and insight appear normal. Mood & affect appropriate.     Data Reviewed: I have personally reviewed following labs and imaging studies  CBC: Recent Labs  Lab 07/16/23 0955 07/18/23 1245 07/19/23 0340  WBC 12.1* 10.9* 8.4  NEUTROABS  --  8.9*  --   HGB 16.6 15.1 13.1  HCT 48.2 43.3 38.4*  MCV 93.2 92.9 94.1  PLT 175 215 207    Basic Metabolic Panel: Recent Labs  Lab 07/16/23 0955 07/18/23 1245 07/19/23 0340  NA 130* 132* 132*  K 5.3* 4.7 4.1  CL 98 98 98  CO2 18* 24 26  GLUCOSE 133* 119* 98  BUN 27* 30* 29*  CREATININE 1.71* 1.89* 1.66*  CALCIUM 8.9 8.6* 8.2*    GFR: Estimated Creatinine Clearance: 44.1 mL/min (A) (by C-G formula based on SCr of 1.66 mg/dL (H)).  Liver Function Tests: Recent Labs  Lab 07/16/23 0955 07/18/23 1245  AST 36 20  ALT QUANTITY NOT SUFFICIENT, UNABLE TO PERFORM TEST 20  ALKPHOS 60 55  BILITOT 2.8* 1.6*  PROT 7.1 6.8  ALBUMIN 3.8 3.4*    CBG: No results for input(s): "GLUCAP" in the last 168 hours.   No results found for this or any previous visit (from the past 240 hours).  Radiology Studies: DG C-Arm 1-60 Min-No Report Result Date: 07/19/2023 Fluoroscopy was utilized by the requesting physician.  No radiographic interpretation.   US  RENAL Result Date: 07/18/2023 CLINICAL DATA:  Hydroureteronephrosis EXAM: RENAL / URINARY TRACT ULTRASOUND COMPLETE COMPARISON:  CT abdomen and pelvis 07/13/2023 FINDINGS: Right Kidney: Renal measurements: 12.1 x 6.9 x 5.6 cm = volume: 244 mL. Echogenicities within normal limits. No hydronephrosis. There is a simple cyst in the superior pole measuring 4.5 x 4.4 x 4.1 cm. Left Kidney: Renal measurements: 11.2 x 5.2 x 5.3 cm = volume: 161 mL. Echogenicity within normal limits. No mass or hydronephrosis visualized. Bladder: Appears normal for degree of bladder distention. Other: None. IMPRESSION: 1. No hydronephrosis. 2. Simple cyst in the superior pole of the right kidney.  Electronically Signed   By: Tyron Gallon M.D.   On: 07/18/2023 20:38        Scheduled Meds:  aspirin EC  81 mg Oral Daily   enoxaparin (LOVENOX) injection  40 mg Subcutaneous Q24H   finasteride   5 mg Oral Daily   ketorolac   15 mg Intravenous Once   nebivolol   10 mg Oral Daily   polyethylene glycol  17 g Oral BID   rosuvastatin  10 mg Oral Daily   senna-docusate  1 tablet Oral BID   sodium chloride  flush  3 mL Intravenous Q12H   tamsulosin  0.4 mg Oral Daily   Continuous Infusions:  sodium chloride  100 mL/hr at 07/19/23 1108     LOS: 1 day    Time spent: 40 minutes    Hilda Lovings, MD [facility]   To contact the attending provider between 7A-7P or the covering provider during after hours 7P-7A, please log into the web site [email or web address] and access using universal [facility] password for that web site. If you do not have the password, please call the hospital operator.  07/19/2023, 11:50 AM

## 2023-07-19 NOTE — Transfer of Care (Addendum)
 Immediate Anesthesia Transfer of Care Note  Patient: Wesley Johnson  Procedure(s) Performed: Procedure(s): CYSTOSCOPY/URETEROSCOPY/HOLMIUM LASER/STENT PLACEMENT (Right)  Patient Location: PACU  Anesthesia Type:General  Level of Consciousness: Patient easily awoken, comfortable, cooperative, following commands, responds to stimulation.   Airway & Oxygen Therapy: Patient spontaneously breathing, ventilating well, oxygen via simple oxygen mask.  Post-op Assessment: Report given to PACU RN, vital signs reviewed and stable, moving all extremities.   Post vital signs: Reviewed and stable.  Complications: No apparent anesthesia complications Last Vitals:  Vitals Value Taken Time  BP 137/64 07/19/23 0956  Temp    Pulse  60  07/19/23 0956  Resp 13 07/19/23 0956  SpO2 100 07/19/23 0956    Last Pain:  Vitals:   07/19/23 0741  TempSrc: Oral  PainSc: 0-No pain         Complications: No notable events documented.

## 2023-07-19 NOTE — Progress Notes (Signed)
   07/19/23 1534  TOC Brief Assessment  Insurance and Status Reviewed  Patient has primary care physician Yes  Home environment has been reviewed resides in private residence  Prior level of function: Independent  Prior/Current Home Services No current home services  Social Drivers of Health Review SDOH reviewed no interventions necessary  Readmission risk has been reviewed Yes  Transition of care needs no transition of care needs at this time

## 2023-07-19 NOTE — Anesthesia Postprocedure Evaluation (Signed)
 Anesthesia Post Note  Patient: Wesley Johnson  Procedure(s) Performed: CYSTOSCOPY/URETEROSCOPY/HOLMIUM LASER/STENT PLACEMENT (Right)     Patient location during evaluation: PACU Anesthesia Type: General Level of consciousness: awake Pain management: pain level controlled Vital Signs Assessment: post-procedure vital signs reviewed and stable Respiratory status: spontaneous breathing, nonlabored ventilation and respiratory function stable Cardiovascular status: blood pressure returned to baseline and stable Postop Assessment: no apparent nausea or vomiting Anesthetic complications: no   No notable events documented.  Last Vitals:  Vitals:   07/19/23 1015 07/19/23 1040  BP: (!) 143/64 (!) 159/73  Pulse: 66 79  Resp: 20 18  Temp:  (!) 36.3 C  SpO2: 97% 99%    Last Pain:  Vitals:   07/19/23 1040  TempSrc: Oral  PainSc: 0-No pain                 Conard Decent

## 2023-07-20 ENCOUNTER — Encounter (HOSPITAL_COMMUNITY): Payer: Self-pay | Admitting: Urology

## 2023-07-20 DIAGNOSIS — K5900 Constipation, unspecified: Secondary | ICD-10-CM | POA: Diagnosis not present

## 2023-07-20 DIAGNOSIS — I10 Essential (primary) hypertension: Secondary | ICD-10-CM | POA: Diagnosis not present

## 2023-07-20 DIAGNOSIS — N132 Hydronephrosis with renal and ureteral calculous obstruction: Secondary | ICD-10-CM | POA: Diagnosis not present

## 2023-07-20 DIAGNOSIS — N138 Other obstructive and reflux uropathy: Secondary | ICD-10-CM | POA: Diagnosis not present

## 2023-07-20 DIAGNOSIS — N179 Acute kidney failure, unspecified: Secondary | ICD-10-CM | POA: Diagnosis not present

## 2023-07-20 DIAGNOSIS — E782 Mixed hyperlipidemia: Secondary | ICD-10-CM | POA: Diagnosis not present

## 2023-07-20 DIAGNOSIS — N400 Enlarged prostate without lower urinary tract symptoms: Secondary | ICD-10-CM | POA: Diagnosis not present

## 2023-07-20 DIAGNOSIS — E871 Hypo-osmolality and hyponatremia: Secondary | ICD-10-CM | POA: Diagnosis not present

## 2023-07-20 DIAGNOSIS — I2510 Atherosclerotic heart disease of native coronary artery without angina pectoris: Secondary | ICD-10-CM | POA: Diagnosis not present

## 2023-07-20 LAB — CBC WITH DIFFERENTIAL/PLATELET
Abs Immature Granulocytes: 0.03 10*3/uL (ref 0.00–0.07)
Basophils Absolute: 0 10*3/uL (ref 0.0–0.1)
Basophils Relative: 0 %
Eosinophils Absolute: 0 10*3/uL (ref 0.0–0.5)
Eosinophils Relative: 0 %
HCT: 40.1 % (ref 39.0–52.0)
Hemoglobin: 13.5 g/dL (ref 13.0–17.0)
Immature Granulocytes: 0 %
Lymphocytes Relative: 17 %
Lymphs Abs: 1.7 10*3/uL (ref 0.7–4.0)
MCH: 32.4 pg (ref 26.0–34.0)
MCHC: 33.7 g/dL (ref 30.0–36.0)
MCV: 96.2 fL (ref 80.0–100.0)
Monocytes Absolute: 0.7 10*3/uL (ref 0.1–1.0)
Monocytes Relative: 7 %
Neutro Abs: 7.4 10*3/uL (ref 1.7–7.7)
Neutrophils Relative %: 76 %
Platelets: 243 10*3/uL (ref 150–400)
RBC: 4.17 MIL/uL — ABNORMAL LOW (ref 4.22–5.81)
RDW: 12 % (ref 11.5–15.5)
WBC: 9.9 10*3/uL (ref 4.0–10.5)
nRBC: 0 % (ref 0.0–0.2)

## 2023-07-20 LAB — BASIC METABOLIC PANEL WITH GFR
Anion gap: 8 (ref 5–15)
BUN: 21 mg/dL (ref 8–23)
CO2: 25 mmol/L (ref 22–32)
Calcium: 8.1 mg/dL — ABNORMAL LOW (ref 8.9–10.3)
Chloride: 99 mmol/L (ref 98–111)
Creatinine, Ser: 1.17 mg/dL (ref 0.61–1.24)
GFR, Estimated: >60 mL/min (ref >60)
Glucose, Bld: 114 mg/dL — ABNORMAL HIGH (ref 70–99)
Potassium: 4 mmol/L (ref 3.5–5.1)
Sodium: 132 mmol/L — ABNORMAL LOW (ref 135–145)

## 2023-07-20 MED ORDER — PHENAZOPYRIDINE HCL 100 MG PO TABS: 100.0000 mg | ORAL_TABLET | Freq: Three times a day (TID) | ORAL | 0 refills | Status: AC

## 2023-07-20 MED ORDER — POLYETHYLENE GLYCOL 3350 17 G PO PACK: 17.0000 g | PACK | Freq: Every day | ORAL | 0 refills | Status: AC | PRN

## 2023-07-20 NOTE — Evaluation (Signed)
 Physical Therapy One Time Evaluation Patient Details Name: Wesley Johnson MRN: [id number] DOB: 1940/07/06 Today's Date: 07/20/2023  History of Present Illness  83 year old gentlemanwho  presented to ED with worsening right flank pain secondary to kidney stone.  Patient noted to have been experiencing pain in the right flank seen by PCP underwent outpatient CT scan on 5/15 which revealed stones in the mid to distal right ureter measuring 4 mm with mild hydroureteronephrosis.  Pt underwent cystoscopy with right uteroscopy with laser lithotripsy and ureteral stent placement on 07/19/2023.  PMHx: hypertension, hyperlipidemia, CAD, nephrolithiasis, BPH, GERD, DJD  Clinical Impression  Patient evaluated by Physical Therapy with no further acute PT needs identified. All education has been completed and the patient has no further questions.  Pt ambulated in hallway and reports feeling at baseline other than soreness.  Pt's son present and lives with pt.  He states he can assist pt at home if any needs. No further follow-up Physical Therapy or equipment needs. PT is signing off. Thank you for this referral.         If plan is discharge home, recommend the following:     Can travel by private vehicle        Equipment Recommendations None recommended by PT  Recommendations for Other Services       Functional Status Assessment Patient has not had a recent decline in their functional status     Precautions / Restrictions Precautions Precautions: Fall      Mobility  Bed Mobility Overal bed mobility: Needs Assistance Bed Mobility: Supine to Sit, Sit to Supine     Supine to sit: Supervision, HOB elevated Sit to supine: Supervision, HOB elevated        Transfers Overall transfer level: Needs assistance Equipment used: None Transfers: Sit to/from Stand Sit to Stand: Supervision                Ambulation/Gait Ambulation/Gait assistance: Supervision Gait Distance (Feet): 350  Feet Assistive device: None Gait Pattern/deviations: WFL(Within Functional Limits)       General Gait Details: no symptoms reported, no unsteadiness or LOB observed  Stairs            Wheelchair Mobility     Tilt Bed    Modified Rankin (Stroke Patients Only)       Balance Overall balance assessment: No apparent balance deficits (not formally assessed)                                           Pertinent Vitals/Pain Pain Assessment Pain Assessment: 0-10 Pain Score: 4  Pain Location: abdomen with transitional movements, none at rest Pain Descriptors / Indicators: Sore Pain Intervention(s): Repositioned, Monitored during session    Home Living Family/patient expects to be discharged to:: Private residence Living Arrangements: Children Available Help at Discharge: Family (son)   Home Access: Level entry       Home Layout: One level Home Equipment: None      Prior Function Prior Level of Function : Independent/Modified Independent                     Extremity/Trunk Assessment        Lower Extremity Assessment Lower Extremity Assessment: Overall WFL for tasks assessed    Cervical / Trunk Assessment Cervical / Trunk Assessment: Kyphotic  Communication   Communication Communication: No apparent difficulties  Cognition Arousal: Alert Behavior During Therapy: WFL for tasks assessed/performed   PT - Cognitive impairments: No apparent impairments                                 Cueing       General Comments      Exercises     Assessment/Plan    PT Assessment Patient does not need any further PT services  PT Problem List         PT Treatment Interventions      PT Goals (Current goals can be found in the Care Plan section)  Acute Rehab PT Goals PT Goal Formulation: All assessment and education complete, DC therapy    Frequency       Co-evaluation               AM-PAC PT "6 Clicks"  Mobility  Outcome Measure Help needed turning from your back to your side while in a flat bed without using bedrails?: None Help needed moving from lying on your back to sitting on the side of a flat bed without using bedrails?: None Help needed moving to and from a bed to a chair (including a wheelchair)?: None Help needed standing up from a chair using your arms (e.g., wheelchair or bedside chair)?: None Help needed to walk in hospital room?: A Little Help needed climbing 3-5 steps with a railing? : A Little 6 Click Score: 22    End of Session   Activity Tolerance: Patient tolerated treatment well Patient left: in bed;with call bell/phone within reach;with family/visitor present Nurse Communication: Mobility status PT Visit Diagnosis: Difficulty in walking, not elsewhere classified (R26.2)    Time: 1610-9604 PT Time Calculation (min) (ACUTE ONLY): 10 min   Charges:   PT Evaluation $PT Eval Low Complexity: 1 Low   PT General Charges $$ ACUTE PT VISIT: 1 Visit        Blanch Bunde, DPT Physical Therapist Acute Rehabilitation Services Office: [phone]   Myna Asal Payson 07/20/2023, 12:17 PM

## 2023-07-20 NOTE — Plan of Care (Signed)

## 2023-07-20 NOTE — Discharge Summary (Signed)
 Physician Discharge Summary  Wesley Johnson VZD:[id number] DOB: [birth year] DOA: 07/18/2023  PCP: Jeannine Milroy., MD  Admit date: 07/18/2023 Discharge date: 07/20/2023  Time spent: 60 minutes  Recommendations for Outpatient Follow-up:  Follow-up with Jeannine Milroy., MD in 2 weeks.  On follow-up patient will need a basic metabolic profile done to follow-up on electrolytes and renal function.  Patient's blood pressure needs to be reassessed on follow-up. Follow-up with Dr. Parke Boll, urology in 1 week as scheduled for stent removal.   Discharge Diagnoses:  Principal Problem:   Ureteral stone with hydronephrosis Active Problems:   AKI (acute kidney injury) (HCC)   Hyponatremia   Constipation   Essential hypertension   CAD S/P percutaneous coronary angioplasty   Mixed hyperlipidemia   BPH (benign prostatic hyperplasia)   Hydroureteronephrosis   Obstructive nephropathy   Discharge Condition: Stable and improved  Diet recommendation: Heart healthy  Filed Weights   07/19/23 0741  Weight: 103 kg    History of present illness:  HPI per Dr. Lillie Reining is a 83 y.o. male with medical history significant of hypertension, hyperlipidemia, CAD, nephrolithiasis, BPH, and GERD who presents with worsening worsening pain due to a kidney stone.   He has been experiencing worsening pain primarily located in the right flank.  Symptoms have been present for least the last 5 days.  He initially had gone to his primary care provider's office and had been sent for an outpatient CT scan of the abdomen pelvis which was performed 5/15.  Scan revealed stones in the mid to distal right ureter measuring 4 mm  and 4 mm with mild hydroureteronephrosis.  Due to his symptoms he was seen in the emergency department on 5/18 and had been prescribed oxycodone  and a stool softener with plans to see the urologist yesterday.  He had gone to urology as recommended, but was unable to have any procedure  performed performed due to him having eaten. It was then scheduled to be done approximately two weeks later. However, due to worsening pain which was not being managed managed well with the pain medications, he returned to the hospital for further evaluation.      No fever, chills, blood in urine, or discomfort while urinating.  Patient does report having not had a bowel movement in a few days now.    Patient was noted to have stable vital signs.  Labs significant for WBC 10.9, sodium 132, BUN 30, and creatinine 1.89.  Urinalysis did not note any significant signs signs for infection.  Urology had been consulted and plan on taking to the operating room tomorrow at [facility].  Hospital Course:  #1 right ureteral calculi with hydronephrosis/right ureteral obstruction secondary to calculi/renal colic - Patient presented with worsening right flank pain, underwent outpatient CT that showed mild right hydroureteronephrosis and kidney stones in the mid to distal right ureter. - Patient followed up with urology in the outpatient setting was to have a procedure however due to oral intake procedure canceled. - Patient presented back to the ED with worsening right flank pain, seen in consultation by urology underwent cystoscopy with urethral dilation, right retrograde pyelogram, right uteroscopy with laser lithotripsy, ureteral stent placement this morning 07/19/2023. - Patient with complaints of dysuria and as such was started on Pyridium per urology. -Patient noted initially with hematuria that improved and had resolved by day of discharge. -Patient hydrated with IV fluids, pain improved during the hospitalization and patient placed on supportive care. -  Will need outpatient follow-up with urology for stent removal in 1 week.   2.  AKI - Creatinine noted to be elevated at 1.89 with a BUN of 30 on admission. - No recent baseline labs on epic noted. - AKI likely multifactorial secondary to postrenal  azotemia secondary to obstruction due to renal calculi in the setting of probable dehydration. -Patient now underwent procedure for renal calculi as noted in problem #1 and also placed on IV fluids for hydration.  Patient's ARB held during the hospitalization and will be resumed 4-5 days postdischarge. -Renal function improved and trended down such that by day of discharge creatinine was down to 1.17 from as high as 1.89 on admission. -Patient was discharged in stable and improved condition. -Outpatient follow-up with PCP and urology.   3.  Hyponatremia -Likely secondary to dehydration. - Remained stable at 132.   - Outpatient follow-up.   4.  Constipation -Patient noted to have some complaints of constipation on admission. - Patient placed on Senokot-S twice daily and MiraLAX  twice daily with good results.   -Patient will discharge home on MiraLAX  as needed as well as Senokot-S as needed.    5.  Hypertension - Patient maintained on home regimen Bystolic , Proscar . - ARB held and will be resumed 4 to 5 days postdischarge.  -Outpatient follow-up with PCP.    6.  BPH - Patient maintained on Flomax, Proscar .   7.  Hyperlipidemia - Patient maintained on home regimen statin.   8.  CAD -Patient with prior single-vessel CAD of the ramus intermedius branch status post DES 09/2010. - Patient maintained on home regimen aspirin, statin, beta-blocker.  Procedures: Cystoscopy with urethral dilation/right retrograde pyelogram, right ureteroscopy with laser lithotripsy, ureteral stent placement per urology: Dr. Parke Boll 07/19/2023   Consultations: Urology: Dr. Parke Boll 07/18/2023   Discharge Exam: Vitals:   07/20/23 0158 07/20/23 0540  BP: (!) 151/72 (!) 159/77  Pulse: 78 73  Resp: 18 18  Temp: 97.9 F (36.6 C) 97.8 F (36.6 C)  SpO2: 93% 94%    General: NAD Cardiovascular: RRR no murmurs rubs or gallop.  No JVD.  No lower extremity edema. Respiratory: Clear to auscultation bilaterally.  No  wheezes, no crackles, no rhonchi.  Fair air movement.  Speaking in full sentences.  Discharge Instructions   Discharge Instructions     Diet - low sodium heart healthy   Complete by: As directed    Increase activity slowly   Complete by: As directed       Allergies as of 07/20/2023       Reactions   Augmentin [amoxicillin-pot Clavulanate] Other (See Comments)   Stomach upset   Codeine Nausea And Vomiting   Ocuflox [ofloxacin] Nausea And Vomiting        Medication List     PAUSE taking these medications    losartan  100 MG tablet Wait to take this until: Jul 24, 2023 Commonly known as: COZAAR  TAKE ONE TABLET BY MOUTH EVERY DAY       STOP taking these medications    oxyCODONE -acetaminophen  5-325 MG tablet Commonly known as: PERCOCET/ROXICET       TAKE these medications    acetaminophen  500 MG tablet Commonly known as: TYLENOL  Take 1,000 mg by mouth 2 (two) times daily as needed for mild pain (pain score 1-3), headache or fever.   aspirin EC 81 MG tablet Take 81 mg by mouth daily.   Bystolic  10 MG tablet Generic drug: nebivolol  TAKE 1 TABLET EVERY DAY What changed:  additional instructions   ciprofloxacin  250 MG tablet Commonly known as: CIPRO  Take 250 mg by mouth 2 (two) times daily as needed ("prostate").   finasteride  5 MG tablet Commonly known as: PROSCAR  Take 1 tablet (5 mg total) by mouth daily.   meclizine 25 MG tablet Commonly known as: ANTIVERT as needed for dizziness.   nitroGLYCERIN  0.4 MG SL tablet Commonly known as: NITROSTAT  Place 1 tablet (0.4 mg total) under the tongue every 5 (five) minutes as needed for chest pain (MAX 3 TABLETS).   ondansetron  4 MG tablet Commonly known as: ZOFRAN  Take 4 mg by mouth 2 (two) times daily as needed for nausea or vomiting.   phenazopyridine 100 MG tablet Commonly known as: PYRIDIUM Take 1 tablet (100 mg total) by mouth 3 (three) times daily with meals for 3 days.   polyethylene glycol 17 g  packet Commonly known as: MIRALAX  / GLYCOLAX  Take 17 g by mouth daily as needed.   rosuvastatin 10 MG tablet Commonly known as: CRESTOR Take 10 mg by mouth daily.   senna-docusate 8.6-50 MG tablet Commonly known as: Senokot-S Take 1 tablet by mouth daily for 5 days.   tamsulosin 0.4 MG Caps capsule Commonly known as: FLOMAX Take 0.4 mg by mouth daily.   traMADol 50 MG tablet Commonly known as: ULTRAM Take 50 mg by mouth 2 (two) times daily as needed for moderate pain (pain score 4-6).       Allergies  Allergen Reactions   Augmentin [Amoxicillin-Pot Clavulanate] Other (See Comments)    Stomach upset   Codeine Nausea And Vomiting   Ocuflox [Ofloxacin] Nausea And Vomiting    Follow-up Information     Jeannine Milroy., MD. Schedule an appointment as soon as possible for a visit in 2 week(s).   Specialty: Internal Medicine Contact information: [address] [place] [address] [phone]         Samson Croak, MD. Schedule an appointment as soon as possible for a visit on 07/27/2023.   Specialty: Urology Why: follow up as scheduled. Contact information: [address] [place] [address] [phone]                  The results of significant diagnostics from this hospitalization (including imaging, microbiology, ancillary and laboratory) are listed below for reference.    Significant Diagnostic Studies: DG C-Arm 1-60 Min-No Report Result Date: 07/19/2023 Fluoroscopy was utilized by the requesting physician.  No radiographic interpretation.   US  RENAL Result Date: 07/18/2023 CLINICAL DATA:  Hydroureteronephrosis EXAM: RENAL / URINARY TRACT ULTRASOUND COMPLETE COMPARISON:  CT abdomen and pelvis 07/13/2023 FINDINGS: Right Kidney: Renal measurements: 12.1 x 6.9 x 5.6 cm = volume: 244 mL. Echogenicities within normal limits. No hydronephrosis. There is a simple cyst in the superior pole measuring 4.5 x 4.4 x 4.1 cm. Left Kidney: Renal  measurements: 11.2 x 5.2 x 5.3 cm = volume: 161 mL. Echogenicity within normal limits. No mass or hydronephrosis visualized. Bladder: Appears normal for degree of bladder distention. Other: None. IMPRESSION: 1. No hydronephrosis. 2. Simple cyst in the superior pole of the right kidney. Electronically Signed   By: Tyron Gallon M.D.   On: 07/18/2023 20:38   CT ABDOMEN PELVIS W CONTRAST Addendum Date: 07/13/2023 ADDENDUM #1 There is an error within the findings section of the report. The stones are located within the right ureter as properly seated within the impression. Electronically signed by: [place] Engel MD 07/13/2023 06:12 PM EDT RP Workstation: [id number]  Result Date: 07/13/2023  ORIGINAL REPORT EXAMINATION: CT ABDOMEN PELVIS W CONTRAST CLINICAL INDICATION: Male, 83 years old. Right lower quadrant abdominal pain TECHNIQUE: Axial CT of the abdomen and pelvis with 100 cc Omnipaque  300 intravenous contrast. Multiplanar reformations provided. Unless otherwise specified, incidental thyroid , adrenal, renal lesions do not require dedicated imaging follow up. Additionally, any mentioned pulmonary nodules do not require dedicated imaging follow-up based on the Fleischner guidelines unless otherwise specified. Coronary calcifications are not identified unless otherwise specified. COMPARISON: 02/19/2008 FINDINGS: The lung bases demonstrate minimal atelectatic changes. The heart is normal in size. There are coronary calcifications. The liver is normal. There is cholelithiasis. The spleen is normal. The pancreas is normal. The adrenals are normal. Bilateral renal cysts are noted. There are stones in the mid to distal left ureter measuring 4 mm and 4 mm with mild hydronephrosis. The abdominal aorta is normal in caliber. Scattered atherosclerotic changes are present. Bladder is normal. The prostate is borderline enlarged. There is colonic diverticulosis. The appendix is normal. Large and small bowel loops are otherwise  within normal limits. No free fluid or lymphadenopathy. There are degenerative changes of the spine and bony pelvis. IMPRESSION: Stones in the mid to distal right ureter measuring 4 mm and 4 mm with mild hydroureteronephrosis. DOSE REDUCTION: This exam was performed according to our departmental dose-optimization program which includes automated exposure control, adjustment of the mA and/or kV according to patient size and/or use of iterative reconstruction technique. Electronically signed by: [place] Engel MD 07/13/2023 04:51 PM EDT RP Workstation: [id number]    Microbiology: No results found for this or any previous visit (from the past 240 hours).   Labs: Basic Metabolic Panel: Recent Labs  Lab 07/16/23 0955 07/18/23 1245 07/19/23 0340 07/20/23 0324  NA 130* 132* 132* 132*  K 5.3* 4.7 4.1 4.0  CL 98 98 98 99  CO2 18* 24 26 25   GLUCOSE 133* 119* 98 114*  BUN 27* 30* 29* 21  CREATININE 1.71* 1.89* 1.66* 1.17  CALCIUM 8.9 8.6* 8.2* 8.1*   Liver Function Tests: Recent Labs  Lab 07/16/23 0955 07/18/23 1245  AST 36 20  ALT QUANTITY NOT SUFFICIENT, UNABLE TO PERFORM TEST 20  ALKPHOS 60 55  BILITOT 2.8* 1.6*  PROT 7.1 6.8  ALBUMIN 3.8 3.4*   Recent Labs  Lab 07/18/23 1245  LIPASE 28   No results for input(s): "AMMONIA" in the last 168 hours. CBC: Recent Labs  Lab 07/16/23 0955 07/18/23 1245 07/19/23 0340 07/20/23 0324  WBC 12.1* 10.9* 8.4 9.9  NEUTROABS  --  8.9*  --  7.4  HGB 16.6 15.1 13.1 13.5  HCT 48.2 43.3 38.4* 40.1  MCV 93.2 92.9 94.1 96.2  PLT 175 215 207 243   Cardiac Enzymes: No results for input(s): "CKTOTAL", "CKMB", "CKMBINDEX", "TROPONINI" in the last 168 hours. BNP: BNP (last 3 results) No results for input(s): "BNP" in the last 8760 hours.  ProBNP (last 3 results) No results for input(s): "PROBNP" in the last 8760 hours.  CBG: No results for input(s): "GLUCAP" in the last 168 hours.     Signed:  Hilda Lovings MD.  [facility] 07/20/2023, 6:26 PM

## 2023-07-28 DIAGNOSIS — N401 Enlarged prostate with lower urinary tract symptoms: Secondary | ICD-10-CM | POA: Diagnosis not present

## 2023-07-28 DIAGNOSIS — N201 Calculus of ureter: Secondary | ICD-10-CM | POA: Diagnosis not present

## 2023-07-28 DIAGNOSIS — R350 Frequency of micturition: Secondary | ICD-10-CM | POA: Diagnosis not present

## 2023-07-28 DIAGNOSIS — R8271 Bacteriuria: Secondary | ICD-10-CM | POA: Diagnosis not present

## 2023-08-02 DIAGNOSIS — N179 Acute kidney failure, unspecified: Secondary | ICD-10-CM | POA: Diagnosis not present

## 2023-08-02 DIAGNOSIS — E8881 Metabolic syndrome: Secondary | ICD-10-CM | POA: Diagnosis not present

## 2023-08-02 DIAGNOSIS — R5383 Other fatigue: Secondary | ICD-10-CM | POA: Diagnosis not present

## 2023-08-02 DIAGNOSIS — I2510 Atherosclerotic heart disease of native coronary artery without angina pectoris: Secondary | ICD-10-CM | POA: Diagnosis not present

## 2023-08-02 DIAGNOSIS — D72829 Elevated white blood cell count, unspecified: Secondary | ICD-10-CM | POA: Diagnosis not present

## 2023-08-02 DIAGNOSIS — I129 Hypertensive chronic kidney disease with stage 1 through stage 4 chronic kidney disease, or unspecified chronic kidney disease: Secondary | ICD-10-CM | POA: Diagnosis not present

## 2023-08-02 DIAGNOSIS — I10 Essential (primary) hypertension: Secondary | ICD-10-CM | POA: Diagnosis not present

## 2023-08-02 DIAGNOSIS — E871 Hypo-osmolality and hyponatremia: Secondary | ICD-10-CM | POA: Diagnosis not present

## 2023-08-02 DIAGNOSIS — M545 Low back pain, unspecified: Secondary | ICD-10-CM | POA: Diagnosis not present

## 2023-08-02 DIAGNOSIS — E785 Hyperlipidemia, unspecified: Secondary | ICD-10-CM | POA: Diagnosis not present

## 2023-08-02 DIAGNOSIS — R7301 Impaired fasting glucose: Secondary | ICD-10-CM | POA: Diagnosis not present

## 2023-08-02 DIAGNOSIS — N132 Hydronephrosis with renal and ureteral calculous obstruction: Secondary | ICD-10-CM | POA: Diagnosis not present

## 2023-08-02 DIAGNOSIS — K5909 Other constipation: Secondary | ICD-10-CM | POA: Diagnosis not present

## 2023-08-08 DIAGNOSIS — R300 Dysuria: Secondary | ICD-10-CM | POA: Diagnosis not present

## 2023-08-08 DIAGNOSIS — N3000 Acute cystitis without hematuria: Secondary | ICD-10-CM | POA: Diagnosis not present

## 2023-08-18 DIAGNOSIS — N179 Acute kidney failure, unspecified: Secondary | ICD-10-CM | POA: Diagnosis not present

## 2023-08-18 DIAGNOSIS — E871 Hypo-osmolality and hyponatremia: Secondary | ICD-10-CM | POA: Diagnosis not present

## 2023-08-18 DIAGNOSIS — I959 Hypotension, unspecified: Secondary | ICD-10-CM | POA: Diagnosis not present

## 2023-08-18 DIAGNOSIS — I10 Essential (primary) hypertension: Secondary | ICD-10-CM | POA: Diagnosis not present

## 2023-08-18 DIAGNOSIS — I2510 Atherosclerotic heart disease of native coronary artery without angina pectoris: Secondary | ICD-10-CM | POA: Diagnosis not present

## 2023-08-18 DIAGNOSIS — K219 Gastro-esophageal reflux disease without esophagitis: Secondary | ICD-10-CM | POA: Diagnosis not present

## 2023-08-18 DIAGNOSIS — M542 Cervicalgia: Secondary | ICD-10-CM | POA: Diagnosis not present

## 2023-08-18 DIAGNOSIS — N401 Enlarged prostate with lower urinary tract symptoms: Secondary | ICD-10-CM | POA: Diagnosis not present

## 2023-08-18 DIAGNOSIS — R42 Dizziness and giddiness: Secondary | ICD-10-CM | POA: Diagnosis not present

## 2023-08-30 DIAGNOSIS — N401 Enlarged prostate with lower urinary tract symptoms: Secondary | ICD-10-CM | POA: Diagnosis not present

## 2023-08-30 DIAGNOSIS — R350 Frequency of micturition: Secondary | ICD-10-CM | POA: Diagnosis not present

## 2023-08-30 DIAGNOSIS — R3915 Urgency of urination: Secondary | ICD-10-CM | POA: Diagnosis not present

## 2023-08-30 DIAGNOSIS — R102 Pelvic and perineal pain: Secondary | ICD-10-CM | POA: Diagnosis not present

## 2023-10-18 DIAGNOSIS — H2513 Age-related nuclear cataract, bilateral: Secondary | ICD-10-CM | POA: Diagnosis not present

## 2023-10-18 DIAGNOSIS — H40013 Open angle with borderline findings, low risk, bilateral: Secondary | ICD-10-CM | POA: Diagnosis not present

## 2023-10-18 DIAGNOSIS — H524 Presbyopia: Secondary | ICD-10-CM | POA: Diagnosis not present

## 2023-10-18 DIAGNOSIS — H52201 Unspecified astigmatism, right eye: Secondary | ICD-10-CM | POA: Diagnosis not present

## 2023-12-06 DIAGNOSIS — Z23 Encounter for immunization: Secondary | ICD-10-CM | POA: Diagnosis not present
